# Patient Record
Sex: Male | Born: 1995
Health system: Southern US, Community
[De-identification: ages and names within clinical notes are randomized; demographics above are authoritative.]

## PROBLEM LIST (undated history)

## (undated) DIAGNOSIS — J45909 Unspecified asthma, uncomplicated: Secondary | ICD-10-CM

## (undated) DIAGNOSIS — Z9109 Other allergy status, other than to drugs and biological substances: Secondary | ICD-10-CM

## (undated) HISTORY — DX: Unspecified asthma, uncomplicated: J45.909

## (undated) HISTORY — PX: TONSILLECTOMY AND ADENOIDECTOMY: SUR1326

## (undated) HISTORY — DX: Other allergy status, other than to drugs and biological substances: Z91.09

---
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
## 2015-06-13 ENCOUNTER — Emergency Department
Admission: EM | Admit: 2015-06-13 | Discharge: 2015-06-13 | Disposition: A | Discharge status: Home / Self Care | Payer: Managed Care, Other (non HMO) | Attending: Emergency Medicine | Admitting: Emergency Medicine

## 2015-06-13 ENCOUNTER — Encounter: Payer: Self-pay | Admitting: Emergency Medicine

## 2015-06-13 DIAGNOSIS — Z79899 Other long term (current) drug therapy: Secondary | ICD-10-CM | POA: Diagnosis not present

## 2015-06-13 DIAGNOSIS — Y998 Other external cause status: Secondary | ICD-10-CM | POA: Diagnosis not present

## 2015-06-13 DIAGNOSIS — S199XXA Unspecified injury of neck, initial encounter: Secondary | ICD-10-CM | POA: Diagnosis present

## 2015-06-13 DIAGNOSIS — Y9389 Activity, other specified: Secondary | ICD-10-CM | POA: Diagnosis not present

## 2015-06-13 DIAGNOSIS — S161XXA Strain of muscle, fascia and tendon at neck level, initial encounter: Secondary | ICD-10-CM | POA: Diagnosis not present

## 2015-06-13 DIAGNOSIS — Y92410 Unspecified street and highway as the place of occurrence of the external cause: Secondary | ICD-10-CM | POA: Diagnosis not present

## 2015-06-13 MED ORDER — IBUPROFEN 800 MG PO TABS: 800.0000 mg | ORAL_TABLET | Freq: Three times a day (TID) | ORAL | Status: DC | PRN

## 2015-06-13 MED ORDER — CYCLOBENZAPRINE HCL 10 MG PO TABS
10.0000 mg | ORAL_TABLET | Freq: Once | ORAL | Status: AC
  Administered 2015-06-13: 10 mg via ORAL
  Filled 2015-06-13: qty 1

## 2015-06-13 MED ORDER — IBUPROFEN 800 MG PO TABS
800.0000 mg | ORAL_TABLET | Freq: Once | ORAL | Status: AC
  Administered 2015-06-13: 800 mg via ORAL
  Filled 2015-06-13: qty 1

## 2015-06-13 MED ORDER — CYCLOBENZAPRINE HCL 5 MG PO TABS: 5.0000 mg | ORAL_TABLET | Freq: Three times a day (TID) | ORAL | Status: DC | PRN

## 2015-06-13 NOTE — Discharge Instructions (Signed)
Cervical Sprain A cervical sprain is when the tissues (ligaments) that hold the neck bones in place stretch or tear. HOME CARE   Put ice on the injured area.  Put ice in a plastic bag.  Place a towel between your skin and the bag.  Leave the ice on for 15-20 minutes, 3-4 times a day.  You may have been given a collar to wear. This collar keeps your neck from moving while you heal.  Do not take the collar off unless told by your doctor.  If you have long hair, keep it outside of the collar.  Ask your doctor before changing the position of your collar. You may need to change its position over time to make it more comfortable.  If you are allowed to take off the collar for cleaning or bathing, follow your doctor's instructions on how to do it safely.  Keep your collar clean by wiping it with mild soap and water. Dry it completely. If the collar has removable pads, remove them every 1-2 days to hand wash them with soap and water. Allow them to air dry. They should be dry before you wear them in the collar.  Do not drive while wearing the collar.  Only take medicine as told by your doctor.  Keep all doctor visits as told.  Keep all physical therapy visits as told.  Adjust your work station so that you have good posture while you work.  Avoid positions and activities that make your problems worse.  Warm up and stretch before being active. GET HELP IF:  Your pain is not controlled with medicine.  You cannot take less pain medicine over time as planned.  Your activity level does not improve as expected. GET HELP RIGHT AWAY IF:   You are bleeding.  Your stomach is upset.  You have an allergic reaction to your medicine.  You develop new problems that you cannot explain.  You lose feeling (become numb) or you cannot move any part of your body (paralysis).  You have tingling or weakness in any part of your body.  Your symptoms get worse. Symptoms include:  Pain,  soreness, stiffness, puffiness (swelling), or a burning feeling in your neck.  Pain when your neck is touched.  Shoulder or upper back pain.  Limited ability to move your neck.  Headache.  Dizziness.  Your hands or arms feel week, lose feeling, or tingle.  Muscle spasms.  Difficulty swallowing or chewing. MAKE SURE YOU:   Understand these instructions.  Will watch your condition.  Will get help right away if you are not doing well or get worse.   This information is not intended to replace advice given to you by your health care provider. Make sure you discuss any questions you have with your health care provider.   Document Released: 12/01/2007 Document Revised: 02/14/2013 Document Reviewed: 12/20/2012 Elsevier Interactive Patient Education 2016 [place].  Cryotherapy Cryotherapy means treatment with cold. Ice or gel packs can be used to reduce both pain and swelling. Ice is the most helpful within the first 24 to 48 hours after an injury or flare-up from overusing a muscle or joint. Sprains, strains, spasms, burning pain, shooting pain, and aches can all be eased with ice. Ice can also be used when recovering from surgery. Ice is effective, has very few side effects, and is safe for most people to use. PRECAUTIONS  Ice is not a safe treatment option for people with:  Raynaud phenomenon. This is  a condition affecting small blood vessels in the extremities. Exposure to cold may cause your problems to return.  Cold hypersensitivity. There are many forms of cold hypersensitivity, including:  Cold urticaria. Red, itchy hives appear on the skin when the tissues begin to warm after being iced.  Cold erythema. This is a red, itchy rash caused by exposure to cold.  Cold hemoglobinuria. Red blood cells break down when the tissues begin to warm after being iced. The hemoglobin that carry oxygen are passed into the urine because they cannot combine with blood proteins fast  enough.  Numbness or altered sensitivity in the area being iced. If you have any of the following conditions, do not use ice until you have discussed cryotherapy with your caregiver:  Heart conditions, such as arrhythmia, angina, or chronic heart disease.  High blood pressure.  Healing wounds or open skin in the area being iced.  Current infections.  Rheumatoid arthritis.  Poor circulation.  Diabetes. Ice slows the blood flow in the region it is applied. This is beneficial when trying to stop inflamed tissues from spreading irritating chemicals to surrounding tissues. However, if you expose your skin to cold temperatures for too long or without the proper protection, you can damage your skin or nerves. Watch for signs of skin damage due to cold. HOME CARE INSTRUCTIONS Follow these tips to use ice and cold packs safely.  Place a dry or damp towel between the ice and skin. A damp towel will cool the skin more quickly, so you may need to shorten the time that the ice is used.  For a more rapid response, add gentle compression to the ice.  Ice for no more than 10 to 20 minutes at a time. The bonier the area you are icing, the less time it will take to get the benefits of ice.  Check your skin after 5 minutes to make sure there are no signs of a poor response to cold or skin damage.  Rest 20 minutes or more between uses.  Once your skin is numb, you can end your treatment. You can test numbness by very lightly touching your skin. The touch should be so light that you do not see the skin dimple from the pressure of your fingertip. When using ice, most people will feel these normal sensations in this order: cold, burning, aching, and numbness.  Do not use ice on someone who cannot communicate their responses to pain, such as small children or people with dementia. HOW TO MAKE AN ICE PACK Ice packs are the most common way to use ice therapy. Other methods include ice massage, ice baths,  and cryosprays. Muscle creams that cause a cold, tingly feeling do not offer the same benefits that ice offers and should not be used as a substitute unless recommended by your caregiver. To make an ice pack, do one of the following:  Place crushed ice or a bag of frozen vegetables in a sealable plastic bag. Squeeze out the excess air. Place this bag inside another plastic bag. Slide the bag into a pillowcase or place a damp towel between your skin and the bag.  Mix 3 parts water with 1 part rubbing alcohol. Freeze the mixture in a sealable plastic bag. When you remove the mixture from the freezer, it will be slushy. Squeeze out the excess air. Place this bag inside another plastic bag. Slide the bag into a pillowcase or place a damp towel between your skin and the bag. SEEK  MEDICAL CARE IF:  You develop white spots on your skin. This may give the skin a blotchy (mottled) appearance.  Your skin turns blue or pale.  Your skin becomes waxy or hard.  Your swelling gets worse. MAKE SURE YOU:   Understand these instructions.  Will watch your condition.  Will get help right away if you are not doing well or get worse.   This information is not intended to replace advice given to you by your health care provider. Make sure you discuss any questions you have with your health care provider.   Document Released: 02/08/2011 Document Revised: 07/05/2014 Document Reviewed: 02/08/2011 Elsevier Interactive Patient Education [employer].
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
## 2015-07-22 ENCOUNTER — Ambulatory Visit: Payer: Managed Care, Other (non HMO) | Admitting: Physical Therapy

## 2015-07-24 ENCOUNTER — Encounter: Payer: Managed Care, Other (non HMO) | Admitting: Physical Therapy

## 2015-07-24 ENCOUNTER — Ambulatory Visit: Payer: Managed Care, Other (non HMO) | Attending: Pediatrics | Admitting: Physical Therapy

## 2015-07-24 DIAGNOSIS — M542 Cervicalgia: Secondary | ICD-10-CM | POA: Diagnosis present
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
## 2015-07-24 NOTE — Patient Instructions (Signed)
All exercises provided were adapted from [email or web address]. Patient was provided a written handout with pictures as described. Any additional cues were manually entered in to handout and copied in to this document.  Rows  Rows  Do this sitting  Wrap a theraband around a doorknob.  Start with your elbow straight, then pull back, bending your elbows and squeezing your shoulder blades together.  Slowly return to starting position.     Low Rows  With elbows straight and palms facing forward, pull band back and pinch shoulder blades together down and back.  DO NOT SHRUG

## 2015-07-29 ENCOUNTER — Ambulatory Visit: Payer: Managed Care, Other (non HMO) | Admitting: Physical Therapy

## 2015-07-31 ENCOUNTER — Ambulatory Visit: Payer: Managed Care, Other (non HMO) | Attending: Pediatrics | Admitting: Physical Therapy

## 2015-08-05 ENCOUNTER — Ambulatory Visit: Payer: Managed Care, Other (non HMO) | Admitting: Physical Therapy

## 2015-08-07 ENCOUNTER — Encounter: Payer: Managed Care, Other (non HMO) | Admitting: Physical Therapy

## 2017-07-29 ENCOUNTER — Ambulatory Visit: Payer: Commercial Managed Care - PPO | Attending: Neurology

## 2017-07-29 DIAGNOSIS — R0683 Snoring: Secondary | ICD-10-CM | POA: Diagnosis present

## 2017-07-29 DIAGNOSIS — G4733 Obstructive sleep apnea (adult) (pediatric): Secondary | ICD-10-CM | POA: Insufficient documentation

## 2017-07-30 ENCOUNTER — Ambulatory Visit: Payer: Commercial Managed Care - PPO | Attending: Neurology

## 2017-07-30 DIAGNOSIS — G47419 Narcolepsy without cataplexy: Secondary | ICD-10-CM | POA: Insufficient documentation

## 2017-07-30 DIAGNOSIS — G4710 Hypersomnia, unspecified: Secondary | ICD-10-CM | POA: Insufficient documentation

## 2017-07-30 DIAGNOSIS — G4733 Obstructive sleep apnea (adult) (pediatric): Secondary | ICD-10-CM | POA: Diagnosis not present

## 2017-07-30 DIAGNOSIS — R0683 Snoring: Secondary | ICD-10-CM | POA: Diagnosis present

## 2017-11-23 ENCOUNTER — Emergency Department (HOSPITAL_BASED_OUTPATIENT_CLINIC_OR_DEPARTMENT_OTHER)
Admission: EM | Admit: 2017-11-23 | Discharge: 2017-11-23 | Disposition: A | Discharge status: Home / Self Care | Payer: Commercial Managed Care - PPO | Attending: Emergency Medicine | Admitting: Emergency Medicine

## 2017-11-23 ENCOUNTER — Encounter (HOSPITAL_BASED_OUTPATIENT_CLINIC_OR_DEPARTMENT_OTHER): Payer: Self-pay | Admitting: Emergency Medicine

## 2017-11-23 ENCOUNTER — Other Ambulatory Visit: Payer: Self-pay

## 2017-11-23 DIAGNOSIS — J45909 Unspecified asthma, uncomplicated: Secondary | ICD-10-CM | POA: Insufficient documentation

## 2017-11-23 DIAGNOSIS — R040 Epistaxis: Secondary | ICD-10-CM

## 2017-11-23 DIAGNOSIS — R0982 Postnasal drip: Secondary | ICD-10-CM | POA: Diagnosis not present

## 2017-11-23 MED ORDER — OXYMETAZOLINE HCL 0.05 % NA SOLN
1.0000 | Freq: Once | NASAL | Status: AC
  Administered 2017-11-23: 1 via NASAL
  Filled 2017-11-23: qty 15

## 2017-11-23 NOTE — ED Triage Notes (Signed)
Nose bleed since this morning.

## 2017-11-23 NOTE — ED Notes (Signed)
ED Provider at bedside. 

## 2017-11-23 NOTE — ED Provider Notes (Signed)
[facility] EMERGENCY DEPARTMENT Provider Note   CSN: [id number] Arrival date & time: 11/23/17  0809     History   Chief Complaint Chief Complaint  Patient presents with  . Epistaxis    HPI Gary Simpson is a 22 y.o. male.  22 year old male with history of asthma and seasonal allergies who presents with nosebleed.  While he was driving to work this morning, he had a sudden onset of bleeding from his right naris.  He held pressure but it continued to run down the back of his throat.  He denies any recent trauma.  He does have some seasonal allergy symptoms and occasionally takes over-the-counter medications for it.  He has had one other nosebleed recently but denies any significant history of bleeding.  No anticoagulant use.  The history is provided by the patient.    Past Medical History:  Diagnosis Date  . Asthma   . Environmental allergies     There are no active problems to display for this patient.   History reviewed. No pertinent surgical history.      Home Medications    Prior to Admission medications   Medication Sig Start Date End Date Taking? Authorizing Provider  cyclobenzaprine (FLEXERIL) 5 MG tablet Take 1 tablet (5 mg total) by mouth every 8 (eight) hours as needed for muscle spasms. 06/13/15   Duanne Guess, PA-C  Dexmethylphenidate HCl (FOCALIN PO) Take by mouth daily.    [provider]  ibuprofen (ADVIL,MOTRIN) 800 MG tablet Take 1 tablet (800 mg total) by mouth every 8 (eight) hours as needed. 06/13/15   Duanne Guess, PA-C    Family History No family history on file.  Social History Social History   Tobacco Use  . Smoking status: Never Smoker  . Smokeless tobacco: Never Used  Substance Use Topics  . Alcohol use: No  . Drug use: No     Allergies   Patient has no known allergies.   Review of Systems Review of Systems  HENT: Positive for congestion, nosebleeds and postnasal drip.   Gastrointestinal:  Negative for vomiting.  Neurological: Negative for dizziness and light-headedness.  All other systems reviewed and are negative.    Physical Exam Updated Vital Signs BP 124/63 (BP Location: Right Arm)   Pulse 70   Temp 97.6 F (36.4 C) (Oral)   Resp 18   Ht 6\' 6"  (1.981 m)   Wt 104.3 kg (230 lb)   SpO2 99%   BMI 26.58 kg/m   Physical Exam  Constitutional: He is oriented to person, place, and time. He appears well-developed and well-nourished. No distress.  HENT:  Head: Normocephalic and atraumatic.  Blood in R naris, oropharynx clear and moist  Eyes: Conjunctivae are normal.  Neck: Neck supple.  Neurological: He is alert and oriented to person, place, and time.  Skin: Skin is warm and dry.  Dried blood on neck and clothes  Psychiatric: He has a normal mood and affect. Judgment normal.  Nursing note and vitals reviewed.    ED Treatments / Results  Labs (all labs ordered are listed, but only abnormal results are displayed) Labs Reviewed - No data to display  EKG None  Radiology No results found.  Procedures Procedures (including critical care time)  Medications Ordered in ED Medications  oxymetazoline (AFRIN) 0.05 % nasal spray 1 spray (1 spray Each Nare Given 11/23/17 4097)     Initial Impression / Assessment and Plan / ED Course  I have reviewed  the triage vital signs and the nursing notes.    Well appearing, stable VS, breathing comfortably. Applied afrin and held pressure for ~15 min which achieved hemostasis.  No further bleeding during observation.  Discussed supportive measures at home, recommended nasal saline and no nose blowing as well as daily use of allergy medication.  Return precautions extensively reviewed and he voiced understanding.  Final Clinical Impressions(s) / ED Diagnoses   Final diagnoses:  Right-sided epistaxis    ED Discharge Orders    None       Lanett Lasorsa, Wenda Overland, MD 11/23/17 [id number]

## 2017-12-09 ENCOUNTER — Observation Stay (HOSPITAL_COMMUNITY)
Admission: EM | Admit: 2017-12-09 | Discharge: 2017-12-10 | Disposition: A | Discharge status: Home / Self Care | Payer: Commercial Managed Care - PPO | Attending: Internal Medicine | Admitting: Internal Medicine

## 2017-12-09 ENCOUNTER — Other Ambulatory Visit: Payer: Self-pay

## 2017-12-09 ENCOUNTER — Encounter (HOSPITAL_COMMUNITY): Payer: Self-pay

## 2017-12-09 DIAGNOSIS — R040 Epistaxis: Secondary | ICD-10-CM | POA: Diagnosis not present

## 2017-12-09 DIAGNOSIS — D62 Acute posthemorrhagic anemia: Secondary | ICD-10-CM | POA: Diagnosis not present

## 2017-12-09 DIAGNOSIS — D649 Anemia, unspecified: Secondary | ICD-10-CM | POA: Diagnosis not present

## 2017-12-09 DIAGNOSIS — J4530 Mild persistent asthma, uncomplicated: Secondary | ICD-10-CM | POA: Diagnosis present

## 2017-12-09 DIAGNOSIS — E876 Hypokalemia: Secondary | ICD-10-CM | POA: Diagnosis not present

## 2017-12-09 DIAGNOSIS — Z79899 Other long term (current) drug therapy: Secondary | ICD-10-CM | POA: Diagnosis not present

## 2017-12-09 DIAGNOSIS — J4520 Mild intermittent asthma, uncomplicated: Secondary | ICD-10-CM | POA: Diagnosis present

## 2017-12-09 LAB — CBC WITH DIFFERENTIAL/PLATELET
Basophils Absolute: 0.1 K/uL (ref 0.0–0.1)
Basophils Absolute: 0 10*3/uL (ref 0.0–0.1)
Basophils Relative: 1 %
Basophils Relative: 0 %
Eosinophils Absolute: 0.2 K/uL (ref 0.0–0.7)
Eosinophils Absolute: 0 10*3/uL (ref 0.0–0.7)
Eosinophils Relative: 4 %
Eosinophils Relative: 1 %
HCT: 33.5 % — ABNORMAL LOW (ref 39.0–52.0)
HCT: 28.4 % — ABNORMAL LOW (ref 39.0–52.0)
Hemoglobin: 11.8 g/dL — ABNORMAL LOW (ref 13.0–17.0)
Hemoglobin: 9.8 g/dL — ABNORMAL LOW (ref 13.0–17.0)
Lymphocytes Relative: 33 %
Lymphocytes Relative: 13 %
Lymphs Abs: 2 K/uL (ref 0.7–4.0)
Lymphs Abs: 1.1 10*3/uL (ref 0.7–4.0)
MCH: 32.6 pg (ref 26.0–34.0)
MCH: 32.1 pg (ref 26.0–34.0)
MCHC: 35.2 g/dL (ref 30.0–36.0)
MCHC: 34.5 g/dL (ref 30.0–36.0)
MCV: 92.5 fL (ref 78.0–100.0)
MCV: 93.1 fL (ref 78.0–100.0)
Monocytes Absolute: 0.8 K/uL (ref 0.1–1.0)
Monocytes Absolute: 0.6 10*3/uL (ref 0.1–1.0)
Monocytes Relative: 13 %
Monocytes Relative: 7 %
Neutro Abs: 3 K/uL (ref 1.7–7.7)
Neutro Abs: 6.8 10*3/uL (ref 1.7–7.7)
Neutrophils Relative %: 49 %
Neutrophils Relative %: 79 %
Platelets: 249 K/uL (ref 150–400)
Platelets: 189 10*3/uL (ref 150–400)
RBC: 3.62 MIL/uL — ABNORMAL LOW (ref 4.22–5.81)
RBC: 3.05 MIL/uL — ABNORMAL LOW (ref 4.22–5.81)
RDW: 12.7 % (ref 11.5–15.5)
RDW: 12.9 % (ref 11.5–15.5)
WBC: 6 K/uL (ref 4.0–10.5)
WBC: 8.6 10*3/uL (ref 4.0–10.5)

## 2017-12-09 LAB — POC OCCULT BLOOD, ED: Fecal Occult Bld: NEGATIVE

## 2017-12-09 LAB — I-STAT CG4 LACTIC ACID, ED: Lactic Acid, Venous: 1.14 mmol/L (ref 0.5–1.9)

## 2017-12-09 LAB — TYPE AND SCREEN
ABO/RH(D): O POS
Antibody Screen: NEGATIVE

## 2017-12-09 LAB — ABO/RH: ABO/RH(D): O POS

## 2017-12-09 LAB — BASIC METABOLIC PANEL WITH GFR
Anion gap: 7 (ref 5–15)
BUN: 23 mg/dL — ABNORMAL HIGH (ref 6–20)
CO2: 25 mmol/L (ref 22–32)
Calcium: 8.7 mg/dL — ABNORMAL LOW (ref 8.9–10.3)
Chloride: 109 mmol/L (ref 101–111)
Creatinine, Ser: 0.77 mg/dL (ref 0.61–1.24)
GFR calc Af Amer: >60 mL/min
GFR calc non Af Amer: >60 mL/min
Glucose, Bld: 160 mg/dL — ABNORMAL HIGH (ref 65–99)
Potassium: 3.4 mmol/L — ABNORMAL LOW (ref 3.5–5.1)
Sodium: 141 mmol/L (ref 135–145)

## 2017-12-09 LAB — APTT: aPTT: 27 seconds (ref 24–36)

## 2017-12-09 LAB — I-STAT TROPONIN, ED: Troponin i, poc: 0 ng/mL (ref 0.00–0.08)

## 2017-12-09 LAB — PROTIME-INR
INR: 1.15
Prothrombin Time: 14.6 seconds (ref 11.4–15.2)

## 2017-12-09 MED ORDER — ACETAMINOPHEN 325 MG PO TABS: 650.0000 mg | ORAL_TABLET | Freq: Four times a day (QID) | ORAL | Status: DC | PRN

## 2017-12-09 MED ORDER — ONDANSETRON HCL 4 MG/2ML IJ SOLN: 4.0000 mg | Freq: Four times a day (QID) | INTRAMUSCULAR | Status: DC | PRN

## 2017-12-09 MED ORDER — HYDROCODONE-ACETAMINOPHEN 5-325 MG PO TABS: 1.0000 | ORAL_TABLET | ORAL | Status: DC | PRN

## 2017-12-09 MED ORDER — SODIUM CHLORIDE 0.9 % IV BOLUS
1000.0000 mL | Freq: Once | INTRAVENOUS | Status: AC
  Administered 2017-12-09: 1000 mL via INTRAVENOUS

## 2017-12-09 MED ORDER — ONDANSETRON HCL 4 MG PO TABS: 4.0000 mg | ORAL_TABLET | Freq: Four times a day (QID) | ORAL | Status: DC | PRN

## 2017-12-09 MED ORDER — SILVER NITRATE-POT NITRATE 75-25 % EX MISC
2.0000 | Freq: Once | CUTANEOUS | Status: DC
  Filled 2017-12-09: qty 2

## 2017-12-09 MED ORDER — COCAINE HCL 4 % EX SOLN
4.0000 mL | Freq: Once | CUTANEOUS | Status: DC
  Filled 2017-12-09: qty 4

## 2017-12-09 MED ORDER — SODIUM CHLORIDE 0.9 % IV SOLN
INTRAVENOUS | Status: AC
  Administered 2017-12-09: 23:00:00 via INTRAVENOUS

## 2017-12-09 MED ORDER — ACETAMINOPHEN 650 MG RE SUPP: 650.0000 mg | Freq: Four times a day (QID) | RECTAL | Status: DC | PRN

## 2017-12-09 MED ORDER — OXYMETAZOLINE HCL 0.05 % NA SOLN
1.0000 | Freq: Once | NASAL | Status: AC
  Administered 2017-12-09: 1 via NASAL
  Filled 2017-12-09: qty 15

## 2017-12-09 MED ORDER — ONDANSETRON 4 MG PO TBDP
4.0000 mg | ORAL_TABLET | Freq: Once | ORAL | Status: AC
  Administered 2017-12-09: 4 mg via ORAL
  Filled 2017-12-09: qty 1

## 2017-12-09 NOTE — ED Triage Notes (Signed)
Patient reports that he has had several nosebleed in the past 2 weeks. Patient  States he has been having black stools as well.

## 2017-12-09 NOTE — ED Notes (Signed)
ED TO INPATIENT HANDOFF REPORT  Name/Age/Gender Gary Simpson 22 y.o. male  Code Status   Home/SNF/Other Home  Chief Complaint epistaxis  Level of Care/Admitting Diagnosis ED Disposition    ED Disposition Condition [facility] [place] Hospital Area: [place] [[id number]]  Level of Care: Telemetry [5]  Admit to tele based on following criteria: Other see comments  Comments: tachycaRDIA  Diagnosis: Symptomatic anemia [2458099]  Admitting Physician: Toy Crisanti [[id number]]  Attending Physician: Toy Marsicano [[id number]]  PT Class (Do Not Modify): Observation [104]  PT Acc Code (Do Not Modify): Observation [10022]       Medical History Past Medical History:  Diagnosis Date  . Asthma   . Environmental allergies     Allergies Allergies  Allergen Reactions  . Other     Cats and Dogs.    IV Location/Drains/Wounds Patient Lines/Drains/Airways Status   Active Line/Drains/Airways    Name:   Placement date:   Placement time:   Site:   Days:   Peripheral IV 12/09/17 Left Hand   12/09/17    1306    Hand   less than 1   Peripheral IV 12/09/17 Right Antecubital   12/09/17    1307    Antecubital   less than 1          Labs/Imaging Results for orders placed or performed during the hospital encounter of 12/09/17 (from the past 48 hour(s))  Type and screen [place]     Status: None   Collection Time: 12/09/17  1:10 PM  Result Value Ref Range   ABO/RH(D) O POS    Antibody Screen NEG    Sample Expiration      12/12/2017 Performed at [facility], [place] [address] [place], [place] [address]   Basic metabolic panel     Status: Abnormal   Collection Time: 12/09/17  1:12 PM  Result Value Ref Range   Sodium 141 135 - 145 mmol/L   Potassium 3.4 (L) 3.5 - 5.1 mmol/L   Chloride 109 101 - 111 mmol/L   CO2 25 22 - 32 mmol/L   Glucose, Bld 160 (H) 65 - 99 mg/dL   BUN 23 (H) 6 - 20 mg/dL   Creatinine, Ser 0.77 0.61  - 1.24 mg/dL   Calcium 8.7 (L) 8.9 - 10.3 mg/dL   GFR calc non Af Amer >60 >60 mL/min   GFR calc Af Amer >60 >60 mL/min    Comment: (NOTE) The eGFR has been calculated using the CKD EPI equation. This calculation has not been validated in all clinical situations. eGFR's persistently <60 mL/min signify possible Chronic Kidney Disease.    Anion gap 7 5 - 15    Comment: Performed at [facility], [place] [address], [place], [place] [address]  CBC with Differential     Status: Abnormal   Collection Time: 12/09/17  1:12 PM  Result Value Ref Range   WBC 6.0 4.0 - 10.5 K/uL   RBC 3.62 (L) 4.22 - 5.81 MIL/uL   Hemoglobin 11.8 (L) 13.0 - 17.0 g/dL   HCT 33.5 (L) 39.0 - 52.0 %   MCV 92.5 78.0 - 100.0 fL   MCH 32.6 26.0 - 34.0 pg   MCHC 35.2 30.0 - 36.0 g/dL   RDW 12.7 11.5 - 15.5 %   Platelets 249 150 - 400 K/uL   Neutrophils Relative % 49 %   Neutro Abs 3.0 1.7 - 7.7 K/uL   Lymphocytes  Relative 33 %   Lymphs Abs 2.0 0.7 - 4.0 K/uL   Monocytes Relative 13 %   Monocytes Absolute 0.8 0.1 - 1.0 K/uL   Eosinophils Relative 4 %   Eosinophils Absolute 0.2 0.0 - 0.7 K/uL   Basophils Relative 1 %   Basophils Absolute 0.1 0.0 - 0.1 K/uL    Comment: Performed at [facility], [place] [address], [place], [place] [address]  Protime-INR     Status: None   Collection Time: 12/09/17  1:12 PM  Result Value Ref Range   Prothrombin Time 14.6 11.4 - 15.2 seconds   INR 1.15     Comment: Performed at [facility], [place] [address], [place], [place] [address]  APTT     Status: None   Collection Time: 12/09/17  1:12 PM  Result Value Ref Range   aPTT 27 24 - 36 seconds    Comment: Performed at [facility], [place] [address] [place], [place] [address]  I-Stat Troponin, ED (not at [facility])     Status: None   Collection Time: 12/09/17  1:15 PM  Result Value Ref Range   Troponin i, poc 0.00 0.00 - 0.08 ng/mL   Comment 3            Comment:  Due to the release kinetics of cTnI, a negative result within the first hours of the onset of symptoms does not rule out myocardial infarction with certainty. If myocardial infarction is still suspected, repeat the test at appropriate intervals.   ABO/Rh     Status: None   Collection Time: 12/09/17  1:16 PM  Result Value Ref Range   ABO/RH(D)      O POS Performed at [facility], [place] [address] [place], [place] [address]   POC occult blood, ED Provider will collect     Status: None   Collection Time: 12/09/17  1:47 PM  Result Value Ref Range   Fecal Occult Bld NEGATIVE NEGATIVE  I-Stat CG4 Lactic Acid, ED     Status: None   Collection Time: 12/09/17  2:50 PM  Result Value Ref Range   Lactic Acid, Venous 1.14 0.5 - 1.9 mmol/L  CBC with Differential     Status: Abnormal   Collection Time: 12/09/17  5:37 PM  Result Value Ref Range   WBC 8.6 4.0 - 10.5 K/uL   RBC 3.05 (L) 4.22 - 5.81 MIL/uL   Hemoglobin 9.8 (L) 13.0 - 17.0 g/dL   HCT 28.4 (L) 39.0 - 52.0 %   MCV 93.1 78.0 - 100.0 fL   MCH 32.1 26.0 - 34.0 pg   MCHC 34.5 30.0 - 36.0 g/dL   RDW 12.9 11.5 - 15.5 %   Platelets 189 150 - 400 K/uL   Neutrophils Relative % 79 %   Neutro Abs 6.8 1.7 - 7.7 K/uL   Lymphocytes Relative 13 %   Lymphs Abs 1.1 0.7 - 4.0 K/uL   Monocytes Relative 7 %   Monocytes Absolute 0.6 0.1 - 1.0 K/uL   Eosinophils Relative 1 %   Eosinophils Absolute 0.0 0.0 - 0.7 K/uL   Basophils Relative 0 %   Basophils Absolute 0.0 0.0 - 0.1 K/uL    Comment: Performed at [facility], [place] [address] [place], [place] [address]   No results found.  Pending Labs [employer] (From admission, onward)   Start     Ordered   Signed and Held  HIV antibody (Routine  Testing)  Tomorrow morning,   R     Signed and Held   Signed and Held  Magnesium  Tomorrow morning,   R    Comments:  Call MD if <1.5    Signed and Held   Signed and Held  Phosphorus  Tomorrow morning,   R      Signed and Held   Signed and Held  TSH  Once,   R    Comments:  Cancel if already done within 1 month and notify MD    Signed and Held   Signed and Held  Comprehensive metabolic panel  Once,   R    Comments:  Cal MD for K<3.5 or >5.0    Signed and Held   Signed and Held  CBC  Once,   R    Comments:  Call for hg <8.0    Signed and Held   Signed and Held  CBC  Now then every 8 hours,   R    Comments:  Call md if Hg<8    Signed and Held      Vitals/Pain Today's Vitals   12/09/17 1600 12/09/17 1745 12/09/17 1800 12/09/17 1929  BP: 113/63  130/86 (!) 142/81  Pulse: (!) 104  100 96  Resp: _0 SpO2: 100%  99% 100%  Weight:      Height:      PainSc:  0-No pain      Isolation Precautions No active isolations  Medications Medications  sodium chloride 0.9 % bolus 1,000 mL (has no administration in time range)  oxymetazoline (AFRIN) 0.05 % nasal spray 1 spray (1 spray Each Nare Given 12/09/17 1305)  sodium chloride 0.9 % bolus 1,000 mL (0 mLs Intravenous Stopped 12/09/17 1344)  sodium chloride 0.9 % bolus 1,000 mL (0 mLs Intravenous Stopped 12/09/17 1607)  ondansetron (ZOFRAN-ODT) disintegrating tablet 4 mg (4 mg Oral Given 12/09/17 1706)    Mobility walks with person assist

## 2017-12-09 NOTE — ED Notes (Signed)
ED Provider at bedside. 

## 2017-12-09 NOTE — ED Notes (Signed)
ENT at  beside

## 2017-12-09 NOTE — H&P (Signed)
JIA MOHAMED YTK:[id number] DOB: 1995/09/25 DOA: 12/09/2017     PCP: Erma Pinto, MD   Outpatient Specialists:  NONE   Patient arrived to ER on 12/09/17 at 1248  Patient coming from:   home Lives   With family    Chief Complaint:  Chief Complaint  Patient presents with  . Epistaxis    HPI: BURT PIATEK is a 22 y.o. male with medical history significant of asthma and seasonal allergies     Presented with recurrent nose bleed. Last nose bleed was on 29th of May. And was not relieved by Afrin or epinephrine packing urgent care was started on dexmethylphenidate. He was seen in the emergency department where it was controlled.  On nasal saline spray but continued to have nosebleeds every other day. Nosebleeding has been persistent on the right side he has been getting lightheaded when he tries to stand up although never passed out no associated chest pain abdominal pain.  Occasionally has been vomiting blood back-up when he has swallowed a large amount of it. Patient has been on Flonase Today presents with recurrent nosebleeding as well as black tarry stools.  Regarding pertinent Chronic problems: History of seasonal allergies on Flonase   While in ER: Noted to be tachycardic rectal exam done showed no evidence of gross bleeding  Following Medications were ordered in ER: Medications  oxymetazoline (AFRIN) 0.05 % nasal spray 1 spray (1 spray Each Nare Given 12/09/17 1305)  sodium chloride 0.9 % bolus 1,000 mL (0 mLs Intravenous Stopped 12/09/17 1344)  sodium chloride 0.9 % bolus 1,000 mL (0 mLs Intravenous Stopped 12/09/17 1607)  ondansetron (ZOFRAN-ODT) disintegrating tablet 4 mg (4 mg Oral Given 12/09/17 1706)    Significant initial  Findings: Abnormal Labs Reviewed  BASIC METABOLIC PANEL - Abnormal; Notable for the following components:      Result Value   Potassium 3.4 (*)    Glucose, Bld 160 (*)    BUN 23 (*)    Calcium 8.7 (*)    All other components within  normal limits  CBC WITH DIFFERENTIAL/PLATELET - Abnormal; Notable for the following components:   RBC 3.62 (*)    Hemoglobin 11.8 (*)    HCT 33.5 (*)    All other components within normal limits  CBC WITH DIFFERENTIAL/PLATELET - Abnormal; Notable for the following components:   RBC 3.05 (*)    Hemoglobin 9.8 (*)    HCT 28.4 (*)    All other components within normal limits     Na 141 K 3.4 Cr      Lab Results  Component Value Date   CREATININE 0.77 12/09/2017      WBC  8.6  HG/HCT     Component Value Date/Time   HGB 9.8 (L) 12/09/2017 1737   HCT 28.4 (L) 12/09/2017 1737     PLT 189  INR 1.15  Troponin (Point of Care Test) Recent Labs    12/09/17 1315  TROPIPOC 0.00    Lactic Acid, Venous    Component Value Date/Time   LATICACIDVEN 1.14 12/09/2017 1450     UA not ordered     ECG:  Personally reviewed by me showing: HR : 117 Rhythm: sinus tachycardia   no evidence of ischemic changes QTC 451    ED Triage Vitals  Enc Vitals Group     BP 12/09/17 1304 136/77     Pulse Rate 12/09/17 1304 (!) 106     Resp 12/09/17 1304 (!) 21  Temp --      Temp src --      SpO2 12/09/17 1304 99 %     Weight 12/09/17 1255 230 lb (104.3 kg)     Height 12/09/17 1255 6\' 6"  (1.981 m)     Head Circumference --      Peak Flow --      Pain Score 12/09/17 1255 0     Pain Loc --      Pain Edu? --      Excl. in [place]? --   TMAX(24)@       Latest  Blood pressure 130/86, pulse 100, resp. rate 18, height 6\' 6"  (1.981 m), weight 104.3 kg (230 lb), SpO2 99 %.   ER Provider Called: ENT    Dr.Bates Outpatient follow-up scheduled with  ENT Dr. Tami Ribas.  [place] office.     Hospitalist was called for admission for symptomatic anemia in the setting of recurent epistaxis   Review of Systems:    Pertinent positives include: nose bleeds, fatigue,  melena, dizziness,  Constitutional:  No weight loss, night sweats, Fevers, chills, weight loss  HEENT:  No  headaches, Difficulty swallowing,Tooth/dental problems,Sore throat,  No sneezing, itching, ear ache, nasal congestion, post nasal drip,  Cardio-vascular:  No chest pain, Orthopnea, PND, anasarca, palpitations. no Bilateral lower extremity swelling  GI:  No heartburn, indigestion, abdominal pain, nausea, vomiting, diarrhea, change in bowel habits, loss of appetite, blood in stool, hematemesis Resp:  no shortness of breath at rest. No dyspnea on exertion, No excess mucus, no productive cough, No non-productive cough, No coughing up of blood.No change in color of mucus.No wheezing. Skin:  no rash or lesions. No jaundice GU:  no dysuria, change in color of urine, no urgency or frequency. No straining to urinate.  No flank pain.  Musculoskeletal:  No joint pain or no joint swelling. No decreased range of motion. No back pain.  Psych:  No change in mood or affect. No depression or anxiety. No memory loss.  Neuro: no localizing neurological complaints, no tingling, no weakness, no double vision, no gait abnormality, no slurred speech, no confusion  As per HPI otherwise 10 point review of systems negative.   Past Medical History:   Past Medical History:  Diagnosis Date  . Asthma   . Environmental allergies       History reviewed. No pertinent surgical history.  Social History:  Ambulatory   independently      reports that he has never smoked. He has never used smokeless tobacco. He reports that he does not drink alcohol or use drugs.     Family History:   Family History  Problem Relation Age of Onset  . Diabetes Mother   . Cancer Neg Hx   . CAD Neg Hx     Allergies: Allergies  Allergen Reactions  . Other     Cats and Dogs.     Prior to Admission medications   Medication Sig Start Date End Date Taking? Authorizing Provider  fluticasone (FLONASE) 50 MCG/ACT nasal spray Place 1 spray into both nostrils daily.  11/30/17  Yes [provider]  cyclobenzaprine  (FLEXERIL) 5 MG tablet Take 1 tablet (5 mg total) by mouth every 8 (eight) hours as needed for muscle spasms. Patient not taking: Reported on 12/09/2017 06/13/15   Duanne Guess, PA-C  ibuprofen (ADVIL,MOTRIN) 800 MG tablet Take 1 tablet (800 mg total) by mouth every 8 (eight) hours as needed. Patient not taking: Reported on  12/09/2017 06/13/15   Duanne Guess, PA-C   Physical Exam: Blood pressure 130/86, pulse 100, resp. rate 18, height 6\' 6"  (1.981 m), weight 104.3 kg (230 lb), SpO2 99 %. 1. General:  in No Acute distress, heavily bloodstained clothes  well  -appearing 2. Psychological: Alert and   Oriented 3. Head/ENT:   jjDry Mucous Membranes                          Head Non traumatic, neck supple                          Normal   Dentition 4. SKIN:  decreased Skin turgor,  Skin clean Dry and intact no rash 5. Heart: Regular rate and rhythm no  Murmur, no Rub or gallop 6. Lungs: no wheezes or crackles   7. Abdomen: Soft, non-tender, Non distended bowel sounds present 8. Lower extremities: no clubbing, cyanosis, or edema 9. Neurologically Grossly intact, moving all 4 extremities equally 10. MSK: Normal range of motion   LABS:     Recent Labs  Lab 12/09/17 1312 12/09/17 1737  WBC 6.0 8.6  NEUTROABS 3.0 6.8  HGB 11.8* 9.8*  HCT 33.5* 28.4*  MCV 92.5 93.1  PLT 249 737   Basic Metabolic Panel: Recent Labs  Lab 12/09/17 1312  NA 141  K 3.4*  CL 109  CO2 25  GLUCOSE 160*  BUN 23*  CREATININE 0.77  CALCIUM 8.7*      No results for input(s): AST, ALT, ALKPHOS, BILITOT, PROT, ALBUMIN in the last 168 hours. No results for input(s): LIPASE, AMYLASE in the last 168 hours. No results for input(s): AMMONIA in the last 168 hours.    HbA1C: No results for input(s): HGBA1C in the last 72 hours. CBG: No results for input(s): GLUCAP in the last 168 hours.    Urine analysis: No results found for: COLORURINE, APPEARANCEUR, LABSPEC, PHURINE, GLUCOSEU, HGBUR,  BILIRUBINUR, KETONESUR, PROTEINUR, UROBILINOGEN, NITRITE, LEUKOCYTESUR     Cultures: No results found for: [place], [place], [place], REPTSTATUS   Radiological Exams on Admission: No results found.  Chart has been reviewed    Assessment/Plan  22 y.o. male with medical history significant of asthma and seasonal allergies   Admitted forsymptomatic anemia in the setting of recurent epistaxis    Present on Admission: . Epistaxis -severe requiring hospitalization has been treated and stabilized in the emergency department if recurrence will need ENT consult.  We will have ENT follow-up as an outpatient continue to monitor CBC rehydrate transfuse if hemoglobin continues to drop or if evidence severe epistaxis continues . Symptomatic anemia noted hemoglobin dropped 2 units at this point epistaxis have stopped.  Continue to monitor if recurs or hemoglobin continues to drop may need blood transfusion. Patient states she still feels lightheaded we will continue to rehydrate . Asthma, chronic, mild persistent, uncomplicated stable continue albuterol as needed . Hypokalemia we will replace check magnesium level   Other plan as per orders.  DVT prophylaxis:  SCD    Code Status:  FULL CODE  as per patient   I had personally discussed CODE STATUS with patient   Family Communication:   Family not at  Bedside    Disposition Plan:  To home once workup is complete and patient is stable                       Consults called:  ENT is aware   Admission status: obs   Level of care    tele            Toy Rockwood 12/09/2017, 8:46 PM    [facility]  Pager [phone]   after 2 AM please page floor coverage PA If 7AM-7PM, please contact the day team taking care of the patient  [email or web address]  Password [email or web address]

## 2017-12-09 NOTE — ED Provider Notes (Signed)
[facility] DEPT Provider Note   CSN: [id number] Arrival date & time: 12/09/17  1248     History   Chief Complaint Chief Complaint  Patient presents with  . Epistaxis    HPI Gary Simpson is a 22 y.o. male with history of asthma and seasonal allergies presents for evaluation of acute onset, progressively worsening intermittent nosebleeds for the past 2 weeks.  He was seen and evaluated for this on 11/23/2017 in the ED.  At that time he was driving to work and had sudden onset bleeding from his right nare  And was unable to control the bleeding.  At that time Afrin was applied and pressure was held for approximately 15 minutes which achieved hemostasis.  He was discharged with nasal saline spray.  He states that since then he has had nosebleeds at least every other day if not up to 3 times daily which will take "a long time to stop the bleeding ".  3 days ago he noticed development of melanotic dark, tarry stools.  Denies hematuria.  He is not on any blood thinners.  He typically attempts to apply constant steady pressure but it still takes a long time to achieve hemostasis.  Nosebleed is always on the right side.  He does also note shortness of breath and lightheadedness which primarily occurs with position changes.  Denies syncope no chest pain or abdominal pain.  He will sometimes vomit.  Blood clots after prolonged periods of epistaxis.  No fevers or chills.  The history is provided by the patient.    Past Medical History:  Diagnosis Date  . Asthma   . Environmental allergies     Patient Active Problem List   Diagnosis Date Noted  . Epistaxis 12/09/2017  . Symptomatic anemia 12/09/2017  . Asthma, chronic, mild persistent, uncomplicated 29/79/8921  . Hypokalemia 12/09/2017    History reviewed. No pertinent surgical history.      Home Medications    Prior to Admission medications   Medication Sig Start Date End Date Taking? Authorizing  Provider  fluticasone (FLONASE) 50 MCG/ACT nasal spray Place 1 spray into both nostrils daily.  11/30/17  Yes [provider]  cyclobenzaprine (FLEXERIL) 5 MG tablet Take 1 tablet (5 mg total) by mouth every 8 (eight) hours as needed for muscle spasms. Patient not taking: Reported on 12/09/2017 06/13/15   Duanne Guess, PA-C  ibuprofen (ADVIL,MOTRIN) 800 MG tablet Take 1 tablet (800 mg total) by mouth every 8 (eight) hours as needed. Patient not taking: Reported on 12/09/2017 06/13/15   Duanne Guess, PA-C    Family History Family History  Problem Relation Age of Onset  . Diabetes Mother   . Cancer Neg Hx   . CAD Neg Hx     Social History Social History   Tobacco Use  . Smoking status: Never Smoker  . Smokeless tobacco: Never Used  Substance Use Topics  . Alcohol use: No    Comment: OCCASIONAL  . Drug use: No     Allergies   Other   Review of Systems Review of Systems  Constitutional: Positive for diaphoresis and fatigue.  HENT: Positive for nosebleeds.   Respiratory: Positive for shortness of breath.   Cardiovascular: Negative for chest pain.  Gastrointestinal: Positive for blood in stool and vomiting. Negative for abdominal pain.  Genitourinary: Negative for hematuria.  Neurological: Positive for light-headedness. Negative for syncope.  All other systems reviewed and are negative.    Physical Exam Updated  Vital Signs BP (!) 153/86 (BP Location: Left Arm)   Pulse (!) 101   Resp 18   Ht 6\' 6"  (1.981 m)   Wt 105.9 kg (233 lb 7.5 oz)   SpO2 100%   BMI 26.98 kg/m   Physical Exam  Constitutional: He appears well-developed and well-nourished. No distress.  Sitting upright in chair.  Diaphoretic.  Somewhat pale.  Blood on clothing.  HENT:  Head: Normocephalic.  Nasal septum is midline, no septal hematoma noted.  There is a constant steady stream of blood coming from the right nare and I am unable to visualize much from this area.  Left nare is  patent, no septal abrasion noted.  He is tolerating secretions without difficulty and speaking in full sentences.  There is some blood noted in the posterior oropharynx.  Eyes: Conjunctivae are normal. Right eye exhibits no discharge. Left eye exhibits no discharge.  Neck: Normal range of motion. Neck supple. No JVD present. No tracheal deviation present.  Cardiovascular:  Tachycardic, 2+ radial and DP/PT pulses bl  Pulmonary/Chest: Effort normal and breath sounds normal. No stridor. No respiratory distress.  Mildly tachypneic, speaking in full sentences without difficulty, equal rise and fall of chest  Abdominal: Soft. Bowel sounds are normal. He exhibits no distension. There is no tenderness. There is no guarding.  Genitourinary:  Genitourinary Comments: Examination performed in the presence of a chaperone.  No frank rectal bleeding.  2 small hemorrhoids which are not thrombosed or tender.  No fissures or tears.  Scant amount of stool in the rectal vault.  Musculoskeletal: He exhibits no edema.  Neurological: He is alert.  Skin: Skin is warm. He is diaphoretic. No erythema.  Psychiatric: He has a normal mood and affect. His behavior is normal.  Nursing note and vitals reviewed.    ED Treatments / Results  Labs (all labs ordered are listed, but only abnormal results are displayed) Labs Reviewed  BASIC METABOLIC PANEL - Abnormal; Notable for the following components:      Result Value   Potassium 3.4 (*)    Glucose, Bld 160 (*)    BUN 23 (*)    Calcium 8.7 (*)    All other components within normal limits  CBC WITH DIFFERENTIAL/PLATELET - Abnormal; Notable for the following components:   RBC 3.62 (*)    Hemoglobin 11.8 (*)    HCT 33.5 (*)    All other components within normal limits  CBC WITH DIFFERENTIAL/PLATELET - Abnormal; Notable for the following components:   RBC 3.05 (*)    Hemoglobin 9.8 (*)    HCT 28.4 (*)    All other components within normal limits  CBC - Abnormal;  Notable for the following components:   RBC 2.84 (*)    Hemoglobin 9.2 (*)    HCT 26.2 (*)    All other components within normal limits  PROTIME-INR  APTT  HIV ANTIBODY (ROUTINE TESTING)  MAGNESIUM  PHOSPHORUS  TSH  COMPREHENSIVE METABOLIC PANEL  CBC  CBC  CBC  POC OCCULT BLOOD, ED  I-STAT TROPONIN, ED  I-STAT CG4 LACTIC ACID, ED  TYPE AND SCREEN  ABO/RH    EKG EKG Interpretation  Date/Time:  Friday December 09 2017 13:13:58 EDT Ventricular Rate:  117 PR Interval:    QRS Duration: 117 QT Interval:  323 QTC Calculation: 451 R Axis:   60 Text Interpretation:  Sinus tachycardia Confirmed by Lajean Saver [phone]) on 12/09/2017 2:04:50 PM   Radiology No results found.  Procedures .Epistaxis  Management Date/Time: 12/10/2017 1:11 AM Performed by: Renita Papa, PA-C Authorized by: Renita Papa, PA-C   Consent:    Consent obtained:  Verbal   Consent given by:  Patient   Risks discussed:  Bleeding, nasal injury and pain Anesthesia (see MAR for exact dosages):    Anesthesia method:  None Procedure details:    Treatment site:  R posterior and R anterior   Treatment method:  Nasal tampon   Treatment episode: initial   Post-procedure details:    Assessment:  Bleeding decreased   Patient tolerance of procedure:  Tolerated well, no immediate complications .Epistaxis Management Date/Time: 12/10/2017 1:12 AM Performed by: Renita Papa, PA-C Authorized by: Renita Papa, PA-C   Consent:    Consent obtained:  Verbal   Consent given by:  Patient   Risks discussed:  Bleeding, infection and pain Anesthesia (see MAR for exact dosages):    Anesthesia method:  None Procedure details:    Treatment site:  R anterior and R posterior   Treatment method:  Nasal tampon   Treatment episode: recurring   Post-procedure details:    Assessment:  Bleeding stopped   Patient tolerance of procedure:  Tolerated well, no immediate complications   (including critical care  time)  Medications Ordered in ED Medications  acetaminophen (TYLENOL) tablet 650 mg (has no administration in time range)    Or  acetaminophen (TYLENOL) suppository 650 mg (has no administration in time range)  HYDROcodone-acetaminophen (NORCO/VICODIN) 5-325 MG per tablet 1-2 tablet (has no administration in time range)  ondansetron (ZOFRAN) tablet 4 mg (has no administration in time range)    Or  ondansetron (ZOFRAN) injection 4 mg (has no administration in time range)  0.9 %  sodium chloride infusion ( Intravenous New Bag/Given 12/09/17 2230)  oxymetazoline (AFRIN) 0.05 % nasal spray 1 spray (1 spray Each Nare Given 12/09/17 1305)  sodium chloride 0.9 % bolus 1,000 mL (0 mLs Intravenous Stopped 12/09/17 1344)  sodium chloride 0.9 % bolus 1,000 mL (0 mLs Intravenous Stopped 12/09/17 1607)  ondansetron (ZOFRAN-ODT) disintegrating tablet 4 mg (4 mg Oral Given 12/09/17 1706)  sodium chloride 0.9 % bolus 1,000 mL (0 mLs Intravenous Stopped 12/09/17 2130)     Initial Impression / Assessment and Plan / ED Course  I have reviewed the triage vital signs and the nursing notes.  Pertinent labs & imaging results that were available during my care of the patient were reviewed by me and considered in my medical decision making (see chart for details).     Patient with complaint of recurrent epistaxis.  He is afebrile, persistently tachycardic in the ED.  At one point he was borderline hypotensive.  On initial examination he was diaphoretic and uncomfortable, covered in blood.  Bleeding appears to be coming only from the right nare, unable to visualize whether anterior or posterior.  Afrin and direct pressure did not achieve hemostasis, so a Rhino Rocket was applied.  It did require replacement with a new Rhino Rocket after slipping out while the patient was sleeping. Hemoccult negative, but scant amount of stool on hemoccult card.  Suspect complaint of melanotic stools secondary to ingesting blood from  epistaxis.  Chart review using care everywhere shows that his hemoglobin has dropped from his baseline of around 15 to 11.8 today. 3:08 PM Spoke with Dr. Redmond Baseman with [facility].  Patient has an appointment with [facility] later this month.  He states that it is reasonable to contact them and attempt to  set up a follow-up appointment in the next 3 to 5 days for reevaluation and removal of the [employer]. 3:12 PM Spoke with Levada Dy at [facility].  She has set up an appointment for the patient with Dr. Tami Ribas on Tuesday, June 18 at 10 AM in their [place] office.   7:20 PM Patient remains persistently tachycardic at rest while in the ED despite fluid rehydration with 2 L of fluid.  His hemoglobin has dropped another 2 g on reevaluation.  Additionally, he is orthostatic and feels quite lightheaded and ambulates with an unsteady gait when attempting to ambulate the patient.  Spoke with Dr. Onalee Hua who agrees to assume care of the patient and bring him into the hospital for further evaluation and observation. Final Clinical Impressions(s) / ED Diagnoses   Final diagnoses:  Right-sided epistaxis    ED Discharge Orders    None       Debroah Baller 12/10/17 0116    Lajean Saver, MD 12/10/17 [phone]

## 2017-12-09 NOTE — ED Notes (Signed)
ED Provider at bedside, this RN assisted PA with patient ambulation due to size and compliants of dzziness earlier.  When assisted to sitting position, pt was fine. When pt stood and took few steps, pt c/o dizziness, this RN instructed patient to sit back down on bed.  Pt given peanut butter crackers and ginger ale.

## 2017-12-10 DIAGNOSIS — J4530 Mild persistent asthma, uncomplicated: Secondary | ICD-10-CM

## 2017-12-10 DIAGNOSIS — R040 Epistaxis: Secondary | ICD-10-CM | POA: Diagnosis not present

## 2017-12-10 DIAGNOSIS — E876 Hypokalemia: Secondary | ICD-10-CM | POA: Diagnosis not present

## 2017-12-10 DIAGNOSIS — D62 Acute posthemorrhagic anemia: Secondary | ICD-10-CM | POA: Diagnosis not present

## 2017-12-10 HISTORY — DX: Acute posthemorrhagic anemia: D62

## 2017-12-10 LAB — CBC
HCT: 26.2 % — ABNORMAL LOW (ref 39.0–52.0)
HCT: 26.9 % — ABNORMAL LOW (ref 39.0–52.0)
HCT: 27.2 % — ABNORMAL LOW (ref 39.0–52.0)
HEMOGLOBIN: 9.2 g/dL — AB (ref 13.0–17.0)
Hemoglobin: 9.5 g/dL — ABNORMAL LOW (ref 13.0–17.0)
Hemoglobin: 9.3 g/dL — ABNORMAL LOW (ref 13.0–17.0)
MCH: 32.2 pg (ref 26.0–34.0)
MCH: 32.2 pg (ref 26.0–34.0)
MCH: 32.4 pg (ref 26.0–34.0)
MCHC: 34.9 g/dL (ref 30.0–36.0)
MCHC: 34.6 g/dL (ref 30.0–36.0)
MCHC: 35.1 g/dL (ref 30.0–36.0)
MCV: 92.2 fL (ref 78.0–100.0)
MCV: 92.3 fL (ref 78.0–100.0)
MCV: 93.1 fL (ref 78.0–100.0)
PLATELETS: 197 10*3/uL (ref 150–400)
PLATELETS: 198 10*3/uL (ref 150–400)
Platelets: 207 10*3/uL (ref 150–400)
RBC: 2.84 MIL/uL — ABNORMAL LOW (ref 4.22–5.81)
RBC: 2.89 MIL/uL — AB (ref 4.22–5.81)
RBC: 2.95 MIL/uL — ABNORMAL LOW (ref 4.22–5.81)
RDW: 13 % (ref 11.5–15.5)
RDW: 13 % (ref 11.5–15.5)
RDW: 13 % (ref 11.5–15.5)
WBC: 7.7 10*3/uL (ref 4.0–10.5)
WBC: 8 10*3/uL (ref 4.0–10.5)
WBC: 9.3 10*3/uL (ref 4.0–10.5)

## 2017-12-10 LAB — COMPREHENSIVE METABOLIC PANEL
ALK PHOS: 71 U/L (ref 38–126)
ALT: 15 U/L — AB (ref 17–63)
AST: 16 U/L (ref 15–41)
Albumin: 3.6 g/dL (ref 3.5–5.0)
Anion gap: 6 (ref 5–15)
BILIRUBIN TOTAL: 0.6 mg/dL (ref 0.3–1.2)
BUN: 19 mg/dL (ref 6–20)
CALCIUM: 8.6 mg/dL — AB (ref 8.9–10.3)
CHLORIDE: 109 mmol/L (ref 101–111)
CO2: 27 mmol/L (ref 22–32)
CREATININE: 0.68 mg/dL (ref 0.61–1.24)
GFR calc Af Amer: >60 mL/min (ref >60)
GFR calc non Af Amer: >60 mL/min (ref >60)
Glucose, Bld: 108 mg/dL — ABNORMAL HIGH (ref 65–99)
Potassium: 4 mmol/L (ref 3.5–5.1)
Sodium: 142 mmol/L (ref 135–145)
TOTAL PROTEIN: 6 g/dL — AB (ref 6.5–8.1)

## 2017-12-10 LAB — PHOSPHORUS: PHOSPHORUS: 3.4 mg/dL (ref 2.5–4.6)

## 2017-12-10 LAB — TSH: TSH: 0.657 u[IU]/mL (ref 0.350–4.500)

## 2017-12-10 LAB — HIV ANTIBODY (ROUTINE TESTING W REFLEX): HIV Screen 4th Generation wRfx: NONREACTIVE

## 2017-12-10 LAB — MAGNESIUM: MAGNESIUM: 1.7 mg/dL (ref 1.7–2.4)

## 2017-12-10 MED ORDER — ACETAMINOPHEN 325 MG PO TABS: 650.0000 mg | ORAL_TABLET | Freq: Four times a day (QID) | ORAL | Status: DC | PRN

## 2017-12-10 NOTE — Discharge Summary (Signed)
Physician Discharge Summary  Gary Simpson ZOX:[id number] DOB: [birth year] DOA: 12/09/2017  PCP: Erma Pinto, MD  Admit date: 12/09/2017 Discharge date: 12/10/2017  Admitted From: home Disposition:  home   Recommendations for Outpatient Follow-up:  1. F/u with ENT 2. Check Hb in 1 month   Discharge Condition:  stable   CODE STATUS:  Full code   Consultations:  ENT   Discharge Diagnoses:  Principal Problem:   Epistaxis Active Problems:   Anemia associated with acute blood loss   Asthma, chronic, mild persistent, uncomplicated   Hypokalemia    Subjective: No longer bleeding. No pain, nausea, vomiting.   Brief Summary:  22 y/o male with asthma, seasonal allergies who has been having nose bleeds for 2 wks and is also having black stools. He was initially evaluated on 5/29 for sudden onset of bleeding from right nostril. It resolved with Afrin and pressure. He continued to have bleeding every other day and then noted black stools.   In the ED,a Rhino Rocket was placed by the ED doc in the right nares. It slipped out while he was asleep and was replaced. Hb noted to have dropped 2 grams since last visit.   FOB was negative. Despite receiving 2 L NS, when he ambulated, he became lightheaded and was therefore admitted.   Hospital Course:  Right nares epistaxis - Rhino Rocket has come out with sneezing but no longer bleeding. An appt was set up with ENT for this coming Tues - asked to avoid NSAIDs and use Afrin if bleeding recurs. If it does not resolve, he knows to come back to the ED.  Anemia- acute blood loss - Hb 11.8 on 6/14 >> 9.3 (IVF given as well so partly dilutional) - total 3 L NS given - no longer dizzy- advise to eat high iron containing foods  Asthma - he states is is fairly controlled- he does have an inhaler at home to use PRN   Discharge Exam: Vitals:   12/09/17 2100 12/10/17 0600  BP:  (!) 145/82  Pulse:  (!) 104  Resp:  16  Temp:  98.4 F (36.9 C)   SpO2: 100% 100%   Vitals:   12/09/17 2030 12/09/17 2100 12/09/17 2238 12/10/17 0600  BP: (!) 153/86   (!) 145/82  Pulse: (!) 101   (!) 104  Resp: 18   16  Temp:    98.4 F (36.9 C)  TempSrc:    Axillary  SpO2: 100% 100%  100%  Weight:   105.9 kg (233 lb 7.5 oz)   Height:   6\' 6"  (1.981 m)     General: Pt is alert, awake, not in acute distress Cardiovascular: RRR, S1/S2 +, no rubs, no gallops Respiratory: CTA bilaterally, no wheezing, no rhonchi Abdominal: Soft, NT, ND, bowel sounds + Extremities: no edema, no cyanosis   Discharge Instructions  Discharge Instructions    Diet - low sodium heart healthy   Complete by:  As directed    Increase activity slowly   Complete by:  As directed      Allergies as of 12/10/2017      Reactions   Other    Cats and Dogs.      Medication List    STOP taking these medications   cyclobenzaprine 5 MG tablet Commonly known as:  FLEXERIL   fluticasone 50 MCG/ACT nasal spray Commonly known as:  FLONASE   ibuprofen 800 MG tablet Commonly known as:  ADVIL,MOTRIN     TAKE these  medications   acetaminophen 325 MG tablet Commonly known as:  TYLENOL Take 2 tablets (650 mg total) by mouth every 6 (six) hours as needed for mild pain (or Fever >/= 101).       Allergies  Allergen Reactions  . Other     Cats and Dogs.     Procedures/Studies:   No results found.   The results of significant diagnostics from this hospitalization (including imaging, microbiology, ancillary and laboratory) are listed below for reference.     Microbiology: No results found for this or any previous visit (from the past 240 hour(s)).   Labs: BNP (last 3 results) No results for input(s): BNP in the last 8760 hours. Basic Metabolic Panel: Recent Labs  Lab 12/09/17 1312 12/10/17 0602  NA 141 142  K 3.4* 4.0  CL 109 109  CO2 25 27  GLUCOSE 160* 108*  BUN 23* 19  CREATININE 0.77 0.68  CALCIUM 8.7* 8.6*  MG  --  1.7  PHOS  --  3.4    Liver Function Tests: Recent Labs  Lab 12/10/17 0602  AST 16  ALT 15*  ALKPHOS 71  BILITOT 0.6  PROT 6.0*  ALBUMIN 3.6   No results for input(s): LIPASE, AMYLASE in the last 168 hours. No results for input(s): AMMONIA in the last 168 hours. CBC: Recent Labs  Lab 12/09/17 1312 12/09/17 1737 12/10/17 0039 12/10/17 0602 12/10/17 0739  WBC 6.0 8.6 7.7 8.0 9.3  NEUTROABS 3.0 6.8  --   --   --   HGB 11.8* 9.8* 9.2* 9.5* 9.3*  HCT 33.5* 28.4* 26.2* 27.2* 26.9*  MCV 92.5 93.1 92.3 92.2 93.1  PLT 249 189 207 197 198   Cardiac Enzymes: No results for input(s): CKTOTAL, CKMB, CKMBINDEX, TROPONINI in the last 168 hours. BNP: Invalid input(s): POCBNP CBG: No results for input(s): GLUCAP in the last 168 hours. D-Dimer No results for input(s): DDIMER in the last 72 hours. Hgb A1c No results for input(s): HGBA1C in the last 72 hours. Lipid Profile No results for input(s): CHOL, HDL, LDLCALC, TRIG, CHOLHDL, LDLDIRECT in the last 72 hours. Thyroid function studies Recent Labs    12/10/17 0602  TSH 0.657   Anemia work up No results for input(s): VITAMINB12, FOLATE, FERRITIN, TIBC, IRON, RETICCTPCT in the last 72 hours. Urinalysis No results found for: COLORURINE, APPEARANCEUR, LABSPEC, [place], GLUCOSEU, HGBUR, BILIRUBINUR, KETONESUR, PROTEINUR, UROBILINOGEN, NITRITE, LEUKOCYTESUR Sepsis Labs Invalid input(s): PROCALCITONIN,  WBC,  LACTICIDVEN Microbiology No results found for this or any previous visit (from the past 240 hour(s)).   Time coordinating discharge in minutes: 60  SIGNED:   Debbe Odea, MD  [facility] 12/10/2017, 10:14 AM Pager   If 7PM-7AM, please contact night-coverage [email or web address] Password [email or web address]

## 2017-12-10 NOTE — Progress Notes (Signed)
Nose packing came out. No sign of bleeding.

## 2017-12-10 NOTE — Discharge Instructions (Signed)
Do not blow your nose or put anything in it. Stop using Flonase. Do not use aspirin, Motrin, Ibuprofen, Alleve, Naproxen. Use Tylenol for pain.  Drink fluids, eat well and rest. Eat foods containing Iron.  Use Afrin if the nose begins to ooze but if bleeding does not stop, come to the ED.  See the ENT on Tuesday.   You were cared for by a hospitalist during your hospital stay. If you have any questions about your discharge medications or the care you received while you were in the hospital after you are discharged, you can call the unit and asked to speak with the hospitalist on call if the hospitalist that took care of you is not available. Once you are discharged, your primary care physician will handle any further medical issues. Please note that NO REFILLS for any discharge medications will be authorized once you are discharged, as it is imperative that you return to your primary care physician (or establish a relationship with a primary care physician if you do not have one) for your aftercare needs so that they can reassess your need for medications and monitor your lab values.  Please take all your medications with you for your next visit with your Primary MD. Please ask your Primary MD to get all Hospital records sent to his/her office. Please request your Primary MD to go over all hospital test results at the follow up.    If you experience worsening of your admission symptoms, develop shortness of breath, chest pain, suicidal or homicidal thoughts or a life threatening emergency, you must seek medical attention immediately by calling 911 or calling your MD.   Dennis Bast must read the complete instructions/literature along with all the possible adverse reactions/side effects for all the medicines you take including new medications that have been prescribed to you. Take new medicines after you have completely understood and accpet all the possible adverse reactions/side effects.    Do not drive when  taking pain medications or sedatives.     Do not take more than prescribed Pain, Sleep and Anxiety Medications   If you have smoked or chewed Tobacco in the last 2 yrs please stop. Stop any regular alcohol  and or recreational drug use.   Wear Seat belts while driving.

## 2018-01-11 DIAGNOSIS — M898X8 Other specified disorders of bone, other site: Secondary | ICD-10-CM | POA: Insufficient documentation

## 2018-01-11 DIAGNOSIS — J324 Chronic pansinusitis: Secondary | ICD-10-CM | POA: Insufficient documentation

## 2018-02-09 HISTORY — PX: NOSE SURGERY: SHX723

## 2018-03-15 DIAGNOSIS — D106 Benign neoplasm of nasopharynx: Secondary | ICD-10-CM | POA: Insufficient documentation

## 2019-04-12 ENCOUNTER — Other Ambulatory Visit: Payer: Self-pay

## 2019-04-12 ENCOUNTER — Ambulatory Visit
Admission: EM | Admit: 2019-04-12 | Discharge: 2019-04-12 | Disposition: A | Discharge status: Home / Self Care | Payer: Commercial Managed Care - PPO | Attending: Family Medicine | Admitting: Family Medicine

## 2019-04-12 DIAGNOSIS — K5900 Constipation, unspecified: Secondary | ICD-10-CM

## 2019-04-12 DIAGNOSIS — K625 Hemorrhage of anus and rectum: Secondary | ICD-10-CM | POA: Diagnosis not present

## 2019-04-12 MED ORDER — DOCUSATE SODIUM 100 MG PO CAPS: 100.0000 mg | ORAL_CAPSULE | Freq: Two times a day (BID) | ORAL | 0 refills | Status: DC

## 2019-04-12 NOTE — Discharge Instructions (Signed)
Medication as directed.  If persists, please let us know so that I can refer you to GI.  Take care  Dr. Lacinda Axon

## 2019-04-12 NOTE — ED Provider Notes (Signed)
[facility]    CSN: [id number] Arrival date & time: 04/12/19  1754   History   Chief Complaint Chief Complaint  Patient presents with  . Rectal Bleeding   HPI  23 year old male presents with the above complaint.  Patient reports that earlier this year he had a few bouts of rectal bleeding.  He states that this resolved quickly after he switched toilet tissue.  Patient reports that over the weekend he noted blood again with wiping and with his bowel movements.  Bright red blood per rectum.  No melena.  Denies rectal pain.  No dietary changes.  No known inciting factor.  He does note that he has intermittent constipation as not all of his stools are soft.  No relieving factors.  No other associated symptoms.  No other complaints.  PMH, Surgical Hx, Family Hx, Social History reviewed and updated as below.  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/11/2018  Overview:   Added automatically from request for surgery [id number]   Chronic rhinitis 01/11/2018  Chronic pansinusitis 01/11/2018  Anemia associated with acute blood loss 12/10/2017  Epistaxis 12/09/2017  Asthma, chronic, mild persistent, uncomplicated [id number]  Hypokalemia     Surgical Hx: TONSILLECTOMY AND ADENOIDECTOMY      IR EMBOLIZATION (NON CNS) EXTRACRANIAL 02/22/2018  IR EMBOLIZATION (NON CNS) EXTRACRANIAL 02/22/2018 Sten Carey Bullocks, MD IMG VIR [facility]   PR CRANIOFACIAL APPROACH,EXTRADURAL+ 02/23/2018 Left Procedure: [place]; XTRDURL INCL MAXILLECT; Surgeon: Fara Boros, MD; Location: MAIN OR [facility]; Service: ENT   PR RESECT INFRATEMP FOSSA/EXTRADURL 02/23/2018 Left Procedure: RESECT/EXC LES INFRATEMPORAL FOSSA; EXTRADURAL; Surgeon: Fara Boros, MD; Location: MAIN OR [place]; Service: ENT   PR RESECT BASE ANT CRAN FOSSA/EXTRADURL 02/23/2018 Left Procedure: RESECTION/EXCISION LESION BASE ANTERIOR CRANIAL FOSSA; EXTRADURAL; Surgeon: Fara Boros, MD; Location: MAIN OR [facility]; Service: ENT   PR EXPLOR PTERYGOMAXILL FOSSA 02/23/2018 Left Procedure: PTERYGOMAXILLARY FOSSA SURG ANY APPROACH; Surgeon: Fara Boros, MD; Location: MAIN OR [place]; Service: ENT   PR LIGATE INTERN [place] ARTERY 02/23/2018 Left Procedure: LIG ART; INT MAXIL ART TRANSANTRAL; Surgeon: Fara Boros, MD; Location: MAIN OR [place]; Service: ENT   PR MUSC MYOQ/FSCQ FLAP HEAD&NECK W/NAMED VASC PEDCL 02/23/2018 Left Procedure: MUSCLE, MYOCUTANEOUS, OR FASCIOCUTANEOUS FLAP; HEAD AND NECK WITH NAMED VASCULAR PEDICLE (IE, BUCCINATORS, GENIOGLOSSUS, TEMPORALIS, MASSETER, STERNOCLEIDOMASTOID, LEVATOR SCAPULAE); Surgeon: Fara Boros, MD; Location: MAIN OR [facility]; Service: ENT   PR STEREOTACTIC COMP ASSIST PROC,CRANIAL,EXTRADURAL 02/23/2018 Right Procedure: STEREOTACTIC COMPUTER-ASSISTED (NAVIGATIONAL) PROCEDURE; CRANIAL, EXTRADURAL; Surgeon: Fara Boros, MD; Location: MAIN OR [facility]; Service: ENT     Home Medications    Prior to Admission medications   Medication Sig Start Date End Date Taking? Authorizing Provider  docusate sodium (COLACE) 100 MG capsule Take 1 capsule (100 mg total) by mouth 2 (two) times daily. 04/12/19   Coral Spikes, DO    Family History Family History  Problem Relation Age of Onset  . Diabetes Mother   . Cancer Neg Hx   . CAD Neg Hx     Social History Social History   Tobacco Use  . Smoking status: Never Smoker  . Smokeless tobacco: Never Used  Substance Use Topics  . Alcohol use: No    Comment: OCCASIONAL  . Drug use: No     Allergies   Other   Review of Systems Review of Systems  Constitutional: Negative.   Gastrointestinal: Positive for anal bleeding and constipation. Negative for rectal pain.   Physical  Exam Triage Vital Signs ED Triage Vitals  Enc Vitals Group     BP 04/12/19 1816 121/60     Pulse Rate 04/12/19 1816 61     Resp 04/12/19 1816 18     Temp 04/12/19 1816 97.7 F (36.5  C)     Temp Source 04/12/19 1816 Oral     SpO2 04/12/19 1816 100 %     Weight 04/12/19 1814 189 lb 9.5 oz (86 kg)     Height 04/12/19 1814 6\' 6"  (1.981 m)     Head Circumference --      Peak Flow --      Pain Score 04/12/19 1814 0     Pain Loc --      Pain Edu? --      Excl. in [place]? --    Updated Vital Signs BP 121/60 (BP Location: Left Arm)   Pulse 61   Temp 97.7 F (36.5 C) (Oral)   Resp 18   Ht 6\' 6"  (1.981 m)   Wt 86 kg   SpO2 100%   BMI 21.91 kg/m   Visual Acuity Right Eye Distance:   Left Eye Distance:   Bilateral Distance:    Right Eye Near:   Left Eye Near:    Bilateral Near:     Physical Exam Vitals signs and nursing note reviewed.  Constitutional:      General: He is not in acute distress.    Appearance: Normal appearance. He is not ill-appearing.  HENT:     Head: Normocephalic and atraumatic.  Eyes:     General:        Right eye: No discharge.        Left eye: No discharge.     Conjunctiva/sclera: Conjunctivae normal.  Cardiovascular:     Rate and Rhythm: Normal rate and regular rhythm.     Heart sounds: No murmur.  Pulmonary:     Effort: Pulmonary effort is normal.     Breath sounds: Normal breath sounds. No wheezing, rhonchi or rales.  Abdominal:     General: There is no distension.     Palpations: Abdomen is soft.     Tenderness: There is no abdominal tenderness.  Neurological:     Mental Status: He is alert.  Psychiatric:        Mood and Affect: Mood normal.        Behavior: Behavior normal.    UC Treatments / Results  Labs (all labs ordered are listed, but only abnormal results are displayed) Labs Reviewed - No data to display  EKG   Radiology No results found.  Procedures Procedures (including critical care time)  Medications Ordered in UC Medications - No data to display  Initial Impression / Assessment and Plan / UC Course  I have reviewed the triage vital signs and the nursing notes.  Pertinent labs & imaging  results that were available during my care of the patient were reviewed by me and considered in my medical decision making (see chart for details).    23 year old male presents with rectal bleeding.  Suspect that this is secondary to constipation.  He is well-appearing.  Advised to take Colace as prescribed.  If persists, will need colonoscopy.  Final Clinical Impressions(s) / UC Diagnoses   Final diagnoses:  Constipation, unspecified constipation type  BRBPR (bright red blood per rectum)     Discharge Instructions     Medication as directed.  If persists, please let us know so that I can  refer you to GI.  Take care  Dr. Lacinda Axon    ED Prescriptions    Medication Sig Dispense Auth. Provider   docusate sodium (COLACE) 100 MG capsule Take 1 capsule (100 mg total) by mouth 2 (two) times daily. 60 capsule Thersa Salt G, DO     PDMP not reviewed this encounter.   Coral Spikes, [place] 04/12/19 1923

## 2019-04-12 NOTE — ED Triage Notes (Signed)
Patient states that he has been noticing bright red blood when he wipes after having a large BM. Patient reports that it had improved at one point but has been constant.

## 2019-04-13 ENCOUNTER — Ambulatory Visit: Payer: Self-pay | Admitting: Family Medicine

## 2019-05-18 ENCOUNTER — Encounter: Payer: Self-pay | Admitting: Family Medicine

## 2019-05-18 ENCOUNTER — Ambulatory Visit (INDEPENDENT_AMBULATORY_CARE_PROVIDER_SITE_OTHER): Payer: Commercial Managed Care - PPO | Admitting: Family Medicine

## 2019-05-18 ENCOUNTER — Other Ambulatory Visit: Payer: Self-pay

## 2019-05-18 VITALS — BP 110/70 | HR 91 | Temp 97.3°F | Resp 16 | Ht 78.0 in | Wt 191.0 lb

## 2019-05-18 DIAGNOSIS — K5900 Constipation, unspecified: Secondary | ICD-10-CM | POA: Diagnosis not present

## 2019-05-18 DIAGNOSIS — K625 Hemorrhage of anus and rectum: Secondary | ICD-10-CM

## 2019-05-18 DIAGNOSIS — Z23 Encounter for immunization: Secondary | ICD-10-CM | POA: Diagnosis not present

## 2019-05-18 DIAGNOSIS — K640 First degree hemorrhoids: Secondary | ICD-10-CM

## 2019-05-18 DIAGNOSIS — D106 Benign neoplasm of nasopharynx: Secondary | ICD-10-CM

## 2019-05-18 NOTE — Patient Instructions (Addendum)
   Start taking OTC metamucil powder mixed with a liquid drink every day. Works best if taken at bedtime.    You could take OTC Fibercon tablets if you don't tolerated metamucil, but metamucil works better   Once you've been on Metamucil for a week, you can put the docusate on hold, then just take as needed.

## 2019-05-18 NOTE — Progress Notes (Signed)
Patient: Gary Simpson Male    DOB: 18-Nov-1995   23 y.o.   MRN: [id number] Visit Date: 05/18/2019  Today's Provider: Lelon Huh, MD   Chief Complaint  Patient presents with  . Establish Care   Subjective:     HPI New Patient Establishing Care: Patient is here today to establish care. Patient has a complaint of intermittent  rectal bleeding for the past 2 months. He was seen at  an Urgent care 1 month ago  and prescribed stool softeners. Patient states he continues to have rectal bleeding off and on. The last episode was this morning. He states he has been constipated again this week and states rectal bleeding always occurs during episodes of constipation. He has also notice a small lump in rectum which he thinks is a hemorrhoid.  He denies any change in diet. Does not adhere to any particular diet but rarely eats fruits or vegetables.   Allergies  Allergen Reactions  . Other     Cats and Dogs.     Current Outpatient Medications:  .  docusate sodium (COLACE) 100 MG capsule, Take 1 capsule (100 mg total) by mouth 2 (two) times daily., Disp: 60 capsule, Rfl: 0  Review of Systems  Constitutional: Negative for appetite change, chills, fatigue and fever.  HENT: Negative for congestion, ear pain, hearing loss, nosebleeds and trouble swallowing.   Eyes: Negative for pain and visual disturbance.  Respiratory: Negative for cough, chest tightness and shortness of breath.   Cardiovascular: Negative for chest pain, palpitations and leg swelling.  Gastrointestinal: Positive for anal bleeding and constipation. Negative for abdominal pain, blood in stool, diarrhea, nausea and vomiting.  Endocrine: Negative for polydipsia, polyphagia and polyuria.  Genitourinary: Negative for dysuria and flank pain.  Musculoskeletal: Negative for arthralgias, back pain, joint swelling, myalgias and neck stiffness.  Skin: Negative for color change, rash and wound.  Neurological: Negative for  dizziness, tremors, seizures, speech difficulty, weakness, light-headedness and headaches.  Psychiatric/Behavioral: Negative for behavioral problems, confusion, decreased concentration, dysphoric mood and sleep disturbance. The patient is not nervous/anxious.   All other systems reviewed and are negative.   Social History   Tobacco Use  . Smoking status: Never Smoker  . Smokeless tobacco: Never Used  Substance Use Topics  . Alcohol use: Yes    Comment: OCCASIONAL      Objective:   BP 110/70 (BP Location: Left Arm, Patient Position: Sitting, Cuff Size: Large)   Pulse 91   Temp (!) 97.3 F (36.3 C) (Temporal)   Resp 16   Ht 6\' 6"  (1.981 m)   Wt 191 lb (86.6 kg)   SpO2 95% Comment: room air  BMI 22.07 kg/m  Vitals:   05/18/19 0817  BP: 110/70  Pulse: 91  Resp: 16  Temp: (!) 97.3 F (36.3 C)  TempSrc: Temporal  SpO2: 95%  Weight: 191 lb (86.6 kg)  Height: 6\' 6"  (1.981 m)  Body mass index is 22.07 kg/m.   Physical Exam  General appearance: Well developed, well nourished male, cooperative and in no acute distress Head: Normocephalic, without obvious abnormality, atraumatic Respiratory: Respirations even and unlabored, normal respiratory rate Extremities: All extremities are intact.  Rectal: small soft rectal mass consistent with hemorrhoid. No active bleeding.      Assessment & Plan    1. Constipation, unspecified constipation type Encouraged to consume some fruits and vegetables in diet and start taking daily fiber supplements, preferably a powder mixed in liquid.  2. Grade I hemorrhoids Discussed possible GI referral if bleeding continues.   3. Rectal bleeding Likely secondary to hemorrhoid which are in turn secondary to constipation. Will refer GI if not resolved after getting on daily fiber supplements. May continue docusate prn  4. Need for influenza vaccination  - Flu Vaccine QUAD 6+ mos PF IM (Fluarix Quad PF)  Follow up 1 month    Lelon Huh,  MD  [facility]

## 2019-06-19 ENCOUNTER — Other Ambulatory Visit: Payer: Self-pay

## 2019-06-19 ENCOUNTER — Ambulatory Visit: Payer: Self-pay | Admitting: Family Medicine

## 2019-06-19 ENCOUNTER — Ambulatory Visit: Payer: Commercial Managed Care - PPO | Admitting: Family Medicine

## 2019-06-19 ENCOUNTER — Encounter: Payer: Self-pay | Admitting: Family Medicine

## 2019-06-19 VITALS — BP 126/76 | HR 64 | Temp 97.7°F | Wt 190.4 lb

## 2019-06-19 DIAGNOSIS — K5900 Constipation, unspecified: Secondary | ICD-10-CM

## 2019-06-19 DIAGNOSIS — K640 First degree hemorrhoids: Secondary | ICD-10-CM

## 2019-06-19 NOTE — Progress Notes (Signed)
       Patient: Gary Simpson Male    DOB: 1995/11/26   23 y.o.   MRN: [id number] Visit Date: 06/19/2019  Today's Provider: Lelon Huh, MD   Chief Complaint  Patient presents with  . Constipation  . Hemorrhoids  . Rectal Bleeding   Subjective:     HPI  Follow up for constipation, hemorrhoids and rectal bleeding:  The patient was last seen for this 4 weeks ago. Changes made at last visit include encouraging patient to eat more fruits and vegetables. Patient was also advised to start daily fiber supplements (Metamucil).  He reports good compliance with treatment. Patient states he stopped Metamucil.  He feels that condition is Improved. He denies rectal bleeding, constipation has improved. He reports hemorrhoid "comes and goes" .   ------------------------------------------------------------------------------------  Allergies  Allergen Reactions  . Other     Cats and Dogs.     Current Outpatient Medications:  .  docusate sodium (COLACE) 100 MG capsule, Take 1 capsule (100 mg total) by mouth 2 (two) times daily. (Patient not taking: Reported on 06/19/2019), Disp: 60 capsule, Rfl: 0  Review of Systems  Constitutional: Negative for appetite change, chills and fever.  Respiratory: Negative for chest tightness, shortness of breath and wheezing.   Cardiovascular: Negative for chest pain and palpitations.  Gastrointestinal: Negative for abdominal pain, nausea and vomiting.    Social History   Tobacco Use  . Smoking status: Never Smoker  . Smokeless tobacco: Never Used  Substance Use Topics  . Alcohol use: Yes    Comment: OCCASIONAL      Objective:   BP 126/76 (BP Location: Right Arm, Patient Position: Sitting, Cuff Size: Large)   Pulse 64   Temp 97.7 F (36.5 C) (Temporal)   Wt 190 lb 6.4 oz (86.4 kg)   SpO2 99%   BMI 22.00 kg/m  Vitals:   06/19/19 0920  BP: 126/76  Pulse: 64  Temp: 97.7 F (36.5 C)  TempSrc: Temporal  SpO2: 99%  Weight: 190 lb  6.4 oz (86.4 kg)  Body mass index is 22 kg/m.   Physical Exam  General appearance: Well developed, well nourished male, cooperative and in no acute distress Head: Normocephalic, without obvious abnormality, atraumatic Respiratory: Respirations even and unlabored, normal respiratory rate Extremities: All extremities are intact.  Skin: Skin color, texture, turgor normal. No rashes seen  Psych: Appropriate mood and affect. Neurologic: Mental status: Alert, oriented to person, place, and time, thought content appropriate.       Assessment & Plan    1. Constipation, unspecified constipation type Resolved. Is no longer taking any stool softeners or fiber. Encouraged to start on daily fiber supplement since he does not get much fiber in his diet.   2. Grade I hemorrhoids No bleeding since last office visit. Reports that hemorrhoid does go away between BMs. Advised to call if it returns or becomes more persistent.      Lelon Huh, MD  [facility]

## 2019-07-04 ENCOUNTER — Ambulatory Visit: Payer: Commercial Managed Care - PPO | Attending: Internal Medicine

## 2019-07-04 DIAGNOSIS — Z20822 Contact with and (suspected) exposure to covid-19: Secondary | ICD-10-CM

## 2019-07-06 ENCOUNTER — Telehealth: Payer: Self-pay

## 2019-07-06 ENCOUNTER — Encounter: Payer: Self-pay | Admitting: Family Medicine

## 2019-07-06 LAB — NOVEL CORONAVIRUS, NAA: SARS-CoV-2, NAA: NOT DETECTED

## 2019-07-06 NOTE — Telephone Encounter (Signed)
Patient says his job required him to get a COVID test because his dad tested positive for COVID. Patient was exposed to his dad on 06/28/2019. Patients COVID test was done on 07/04/2019 and was negative. Patient needs a note to return to work that says his test result was negative and he is fine to return to work on Monday. Please advise if ok to write note.    Copied from [place] [id number]. Topic: General - Other >> Jul 06, 2019  1:48 PM Erick Blinks wrote: Reason for CRM: Pt needs a doctor's note to return to work, stating that he has tested negative and is able to return to work. Not currently experiencing symptoms. Requesting for note to be uploaded on [place].

## 2019-07-06 NOTE — Telephone Encounter (Signed)
Ok to write note to return to work so long as he has no symptoms.

## 2019-07-06 NOTE — Telephone Encounter (Signed)
Patient denies having any symptoms. Letter completed. Patient advised to access letter in [facility].

## 2019-07-09 NOTE — Telephone Encounter (Signed)
Note prepared on 07-06-2019 and picked up by patient on 07/09/2019

## 2019-07-31 DIAGNOSIS — K642 Third degree hemorrhoids: Secondary | ICD-10-CM | POA: Diagnosis not present

## 2019-08-09 DIAGNOSIS — K648 Other hemorrhoids: Secondary | ICD-10-CM | POA: Diagnosis not present

## 2019-08-30 DIAGNOSIS — K642 Third degree hemorrhoids: Secondary | ICD-10-CM | POA: Diagnosis not present

## 2020-07-07 ENCOUNTER — Encounter: Payer: Self-pay | Admitting: Family Medicine

## 2020-07-07 ENCOUNTER — Ambulatory Visit (INDEPENDENT_AMBULATORY_CARE_PROVIDER_SITE_OTHER): Payer: BC Managed Care – PPO | Admitting: Family Medicine

## 2020-07-07 ENCOUNTER — Other Ambulatory Visit: Payer: Self-pay

## 2020-07-07 VITALS — BP 134/78 | HR 66 | Temp 97.7°F | Resp 18 | Wt 230.6 lb

## 2020-07-07 DIAGNOSIS — R5383 Other fatigue: Secondary | ICD-10-CM | POA: Diagnosis not present

## 2020-07-07 DIAGNOSIS — Z8669 Personal history of other diseases of the nervous system and sense organs: Secondary | ICD-10-CM

## 2020-07-07 DIAGNOSIS — R400 Somnolence: Secondary | ICD-10-CM | POA: Diagnosis not present

## 2020-07-07 DIAGNOSIS — J4520 Mild intermittent asthma, uncomplicated: Secondary | ICD-10-CM | POA: Diagnosis not present

## 2020-07-07 MED ORDER — ALBUTEROL SULFATE HFA 108 (90 BASE) MCG/ACT IN AERS: 2.0000 | INHALATION_SPRAY | Freq: Four times a day (QID) | RESPIRATORY_TRACT | 0 refills | Status: DC | PRN

## 2020-07-07 NOTE — Patient Instructions (Signed)
.   Please review the attached list of medications and notify my office if there are any errors.   . Please bring all of your medications to every appointment so we can make sure that our medication list is the same as yours.   

## 2020-07-07 NOTE — Progress Notes (Signed)
      Established patient visit   Patient: Gary Simpson   DOB: [birth year]   25 y.o. Male  MRN: [id number] Visit Date: 07/07/2020  Today's healthcare provider: Lelon Huh, MD   Chief Complaint  Patient presents with  . Fatigue   Subjective    HPI  Fatigue: Patient complains of feeling more fatigued than usual for the past couple of years. Patient states he feels tired when he wakes up in the mornings. He denies any dizziness.  He described the fatigue as excessive sleepiness and a lack of energy.   He has history of N/P pharyngeoma and had sleep study and CPAP prescribed prior to its excision in 2020. However he did not tolerate CPAP well and thought that removal of the tumor would take care of OSA.      Medications: Outpatient Medications Prior to Visit  Medication Sig  . cetirizine (ZYRTEC ALLERGY) 10 MG tablet Take 10 mg by mouth daily.   No facility-administered medications prior to visit.    Review of Systems  Constitutional: Positive for fatigue. Negative for appetite change, chills and fever.  Respiratory: Negative for chest tightness, shortness of breath and wheezing.   Cardiovascular: Negative for chest pain and palpitations.  Gastrointestinal: Negative for abdominal pain, nausea and vomiting.      Objective    BP 134/78 (BP Location: Left Arm, Patient Position: Sitting, Cuff Size: Large)   Pulse 66   Temp 97.7 F (36.5 C) (Oral)   Resp 18   Wt 230 lb 9.6 oz (104.6 kg)   BMI 26.65 kg/m    Physical Exam    General: Appearance:     Well developed, well nourished male in no acute distress  Eyes:    PERRL, conjunctiva/corneas clear, EOM's intact       Lungs:     Clear to auscultation bilaterally, respirations unlabored  Heart:    Normal heart rate. Normal rhythm. No murmurs, rubs, or gallops.   MS:   All extremities are intact.   Neurologic:   Awake, alert, oriented x 3. No apparent focal neurological           defect.         Assessment & Plan      1. Sleepiness  - CBC with Differential/Platelet - Comprehensive metabolic panel - TSH  2. Other fatigue  - CBC with Differential/Platelet - Comprehensive metabolic panel - TSH  3. History of obstructive sleep apnea Is not on CPAP since having NP tumor removed in 2020. Anticipated getting UTD sleep study if labs are normal.   4. Asthma, chronic, mild intermittent Rarely requires inhaler, but need refilled.  - albuterol (VENTOLIN HFA) 108 (90 Base) MCG/ACT inhaler; Inhale 2 puffs into the lungs every 6 (six) hours as needed for wheezing or shortness of breath.  Dispense: 8 g; Refill: 0  He declined flu vaccine today.        The entirety of the information documented in the History of Present Illness, Review of Systems and Physical Exam were personally obtained by me. Portions of this information were initially documented by the CMA and reviewed by me for thoroughness and accuracy.      Lelon Huh, MD  [facility] [phone] (phone) [phone] (fax)  [place]

## 2020-07-08 ENCOUNTER — Other Ambulatory Visit: Payer: Self-pay | Admitting: Family Medicine

## 2020-07-08 DIAGNOSIS — R400 Somnolence: Secondary | ICD-10-CM

## 2020-07-08 DIAGNOSIS — R5383 Other fatigue: Secondary | ICD-10-CM

## 2020-07-08 LAB — CBC WITH DIFFERENTIAL/PLATELET
Basophils Absolute: 0.1 10*3/uL (ref 0.0–0.2)
Basos: 1 %
EOS (ABSOLUTE): 0.3 10*3/uL (ref 0.0–0.4)
Eos: 5 %
Hematocrit: 46.8 % (ref 37.5–51.0)
Hemoglobin: 16.1 g/dL (ref 13.0–17.7)
Immature Grans (Abs): 0 10*3/uL (ref 0.0–0.1)
Immature Granulocytes: 0 %
Lymphocytes Absolute: 1.4 10*3/uL (ref 0.7–3.1)
Lymphs: 22 %
MCH: 32.2 pg (ref 26.6–33.0)
MCHC: 34.4 g/dL (ref 31.5–35.7)
MCV: 94 fL (ref 79–97)
Monocytes Absolute: 0.8 10*3/uL (ref 0.1–0.9)
Monocytes: 13 %
Neutrophils Absolute: 3.7 10*3/uL (ref 1.4–7.0)
Neutrophils: 59 %
Platelets: 250 10*3/uL (ref 150–450)
RBC: 5 x10E6/uL (ref 4.14–5.80)
RDW: 12 % (ref 11.6–15.4)
WBC: 6.3 10*3/uL (ref 3.4–10.8)

## 2020-07-08 LAB — COMPREHENSIVE METABOLIC PANEL
ALT: 20 IU/L (ref 0–44)
AST: 24 IU/L (ref 0–40)
Albumin/Globulin Ratio: 2.1 (ref 1.2–2.2)
Albumin: 4.9 g/dL (ref 4.1–5.2)
Alkaline Phosphatase: 105 IU/L (ref 44–121)
BUN/Creatinine Ratio: 14 (ref 9–20)
BUN: 15 mg/dL (ref 6–20)
Bilirubin Total: 0.6 mg/dL (ref 0.0–1.2)
CO2: 25 mmol/L (ref 20–29)
Calcium: 9.5 mg/dL (ref 8.7–10.2)
Chloride: 100 mmol/L (ref 96–106)
Creatinine, Ser: 1.06 mg/dL (ref 0.76–1.27)
GFR calc Af Amer: 113 mL/min/{1.73_m2} (ref >59)
GFR calc non Af Amer: 98 mL/min/{1.73_m2} (ref >59)
Globulin, Total: 2.3 g/dL (ref 1.5–4.5)
Glucose: 89 mg/dL (ref 65–99)
Potassium: 4.4 mmol/L (ref 3.5–5.2)
Sodium: 137 mmol/L (ref 134–144)
Total Protein: 7.2 g/dL (ref 6.0–8.5)

## 2020-07-08 LAB — TSH: TSH: 1.77 u[IU]/mL (ref 0.450–4.500)

## 2020-07-28 ENCOUNTER — Other Ambulatory Visit: Payer: Self-pay | Admitting: Family Medicine

## 2020-07-28 DIAGNOSIS — J4520 Mild intermittent asthma, uncomplicated: Secondary | ICD-10-CM

## 2020-07-29 ENCOUNTER — Telehealth: Payer: Self-pay

## 2020-07-29 NOTE — Telephone Encounter (Signed)
See result note from 07-07-2020. A sleep study was ordered since labs were normal. Has this been scheduled? If not then need to contact sarah to find out the status of the sleep study.

## 2020-07-29 NOTE — Telephone Encounter (Signed)
Copied from [place] [phone]. Topic: General - Other >> Jul 29, 2020  8:36 AM Celene Kras wrote: Reason for CRM: pt called stating that he received his results back for labs. He is requesting to know if he needs to have a follow up appt with PCP. Please advise.

## 2020-07-30 NOTE — Telephone Encounter (Signed)
Gary Pang, do I need to place another order for a sleep study? It looks like you never received the order.

## 2020-07-31 NOTE — Telephone Encounter (Signed)
Order was sent to [facility] on 07/10/20 for home sleep study. I have left message to try to find out if it has been scheduled

## 2020-08-04 NOTE — Telephone Encounter (Signed)
Judson Roch, do you have an update on this message?

## 2020-08-07 NOTE — Telephone Encounter (Signed)
Pt called and is requesting to have an update on his sleep study. Please advise.

## 2020-08-08 NOTE — Telephone Encounter (Signed)
Order for home sleep study was sent to [facility] in Jan. I have left messages to try to find out what is going on with getting this pt scheduled and have not gotten answers. I sent it to [facility] today 08/08/20. They now do home sleep studies. I have also left message to return call so I can advise

## 2020-08-10 DIAGNOSIS — R350 Frequency of micturition: Secondary | ICD-10-CM | POA: Diagnosis not present

## 2020-08-10 DIAGNOSIS — N410 Acute prostatitis: Secondary | ICD-10-CM | POA: Diagnosis not present

## 2020-08-19 ENCOUNTER — Other Ambulatory Visit: Payer: Self-pay

## 2020-08-19 ENCOUNTER — Encounter: Payer: Self-pay | Admitting: Family Medicine

## 2020-08-19 ENCOUNTER — Ambulatory Visit: Payer: BC Managed Care – PPO | Admitting: Family Medicine

## 2020-08-19 VITALS — BP 109/68 | HR 80 | Temp 98.4°F | Resp 16 | Wt 224.6 lb

## 2020-08-19 DIAGNOSIS — R350 Frequency of micturition: Secondary | ICD-10-CM | POA: Diagnosis not present

## 2020-08-19 LAB — POCT URINALYSIS DIPSTICK
Bilirubin, UA: NEGATIVE
Blood, UA: NEGATIVE
Glucose, UA: NEGATIVE
Ketones, UA: NEGATIVE
Leukocytes, UA: NEGATIVE
Nitrite, UA: NEGATIVE
Protein, UA: NEGATIVE
Spec Grav, UA: 1.025 (ref 1.010–1.025)
Urobilinogen, UA: 0.2 E.U./dL
pH, UA: 7 (ref 5.0–8.0)

## 2020-08-19 NOTE — Progress Notes (Signed)
Established patient visit   Patient: Gary Simpson   DOB: 10/16/1995   25 y.o. Male  MRN: [id number] Visit Date: 08/19/2020  Today's healthcare provider: Lelon Huh, MD   Chief Complaint  Patient presents with   Urinary Frequency   Subjective    Urinary Frequency  This is a new problem. Episode onset: 10 days. The problem has been gradually improving. Associated symptoms include frequency and urgency. Pertinent negatives include no chills, nausea or vomiting. He has tried antibiotics (10 day course of Levaquin 750mg ) for the symptoms.   He was seen at [facility] 10 days ago where he had u/a and was prescribed Levaquin as above. He does not know what infection he was diagnosed with.  Patient states symptoms improved since taking antibiotics, but did not completely resolve. He still has urinary frequency and urgency. He denies any pain during urination.       Medications: Outpatient Medications Prior to Visit  Medication Sig   albuterol (VENTOLIN HFA) 108 (90 Base) MCG/ACT inhaler TAKE 2 PUFFS BY MOUTH EVERY 6 HOURS AS NEEDED FOR WHEEZE OR SHORTNESS OF BREATH   cetirizine (ZYRTEC) 10 MG tablet Take 10 mg by mouth daily.   No facility-administered medications prior to visit.    Review of Systems  Constitutional: Negative for appetite change, chills and fever.  Respiratory: Negative for chest tightness, shortness of breath and wheezing.   Cardiovascular: Negative for chest pain and palpitations.  Gastrointestinal: Negative for abdominal pain, nausea and vomiting.  Genitourinary: Positive for frequency and urgency.        Objective    BP 109/68 (BP Location: Left Arm, Patient Position: Sitting, Cuff Size: Normal)    Pulse 80    Temp 98.4 F (36.9 C) (Temporal)    Resp 16    Wt 224 lb 9.6 oz (101.9 kg)    BMI 25.96 kg/m      Physical Exam   General: Appearance:     Well developed, well nourished male in no acute distress  Eyes:    PERRL, conjunctiva/corneas  clear, EOM's intact       Lungs:     Clear to auscultation bilaterally, respirations unlabored  Heart:    Normal heart rate. Normal rhythm. No murmurs, rubs, or gallops.   MS:   All extremities are intact.   Neurologic:   Awake, alert, oriented x 3. No apparent focal neurological           defect.         Results for orders placed or performed in visit on 08/19/20  POCT Urinalysis Dipstick  Result Value Ref Range   Color, UA yellow    Clarity, UA cloudy    Glucose, UA Negative Negative   Bilirubin, UA negative    Ketones, UA negative    Spec Grav, UA 1.025 1.010 - 1.025   Blood, UA negative    pH, UA 7.0 5.0 - 8.0   Protein, UA Negative Negative   Urobilinogen, UA 0.2 0.2 or 1.0 E.U./dL   Nitrite, UA negative    Leukocytes, UA Negative Negative   Appearance     Odor      Assessment & Plan     1. Urinary frequency Resolved after finishing 10 day course of levofloxacin prescribed at [facility]. Send for records from [facility]. If he had bladder or proximal urinary tract infection then will need urology referral. If he was thought to have prostate infection then no additional follow up needed  for the time being.        The entirety of the information documented in the History of Present Illness, Review of Systems and Physical Exam were personally obtained by me. Portions of this information were initially documented by the CMA and reviewed by me for thoroughness and accuracy.      Lelon Huh, MD  [facility] [phone] (phone) [phone] (fax)  [place]

## 2020-08-20 ENCOUNTER — Encounter: Payer: Self-pay | Admitting: Family Medicine

## 2020-08-27 ENCOUNTER — Encounter: Payer: Self-pay | Admitting: Family Medicine

## 2020-08-27 DIAGNOSIS — G4733 Obstructive sleep apnea (adult) (pediatric): Secondary | ICD-10-CM | POA: Insufficient documentation

## 2020-08-30 ENCOUNTER — Other Ambulatory Visit: Payer: Self-pay | Admitting: Family Medicine

## 2020-08-30 DIAGNOSIS — J4520 Mild intermittent asthma, uncomplicated: Secondary | ICD-10-CM

## 2020-08-30 NOTE — Telephone Encounter (Signed)
Requested Prescriptions  Pending Prescriptions Disp Refills  . albuterol (VENTOLIN HFA) 108 (90 Base) MCG/ACT inhaler [Pharmacy Med Name: ALBUTEROL HFA (PROVENTIL) INH] 6.7 each 0    Sig: TAKE 2 PUFFS BY MOUTH EVERY 6 HOURS AS NEEDED FOR WHEEZE OR SHORTNESS OF BREATH     Pulmonology:  Beta Agonists Failed - 08/30/2020  9:02 AM      Failed - One inhaler should last at least one month. If the patient is requesting refills earlier, contact the patient to check for uncontrolled symptoms.      Passed - Valid encounter within last 12 months    Recent Outpatient Visits          1 week ago Urinary frequency   [facility] Birdie Sons, MD   1 month ago [place], Donald E, MD   1 year ago Constipation, unspecified constipation type   [facility] Birdie Sons, MD   1 year ago Constipation, unspecified constipation type   [facility] Birdie Sons, MD

## 2020-08-31 DIAGNOSIS — S99921A Unspecified injury of right foot, initial encounter: Secondary | ICD-10-CM | POA: Diagnosis not present

## 2020-08-31 DIAGNOSIS — Z23 Encounter for immunization: Secondary | ICD-10-CM | POA: Diagnosis not present

## 2020-08-31 DIAGNOSIS — S90811A Abrasion, right foot, initial encounter: Secondary | ICD-10-CM | POA: Diagnosis not present

## 2020-10-15 DIAGNOSIS — R350 Frequency of micturition: Secondary | ICD-10-CM | POA: Diagnosis not present

## 2020-11-04 DIAGNOSIS — M898X8 Other specified disorders of bone, other site: Secondary | ICD-10-CM | POA: Diagnosis not present

## 2020-11-04 DIAGNOSIS — J310 Chronic rhinitis: Secondary | ICD-10-CM | POA: Diagnosis not present

## 2022-08-06 ENCOUNTER — Ambulatory Visit: Payer: BC Managed Care – PPO | Admitting: Physician Assistant

## 2022-08-06 ENCOUNTER — Encounter: Payer: Self-pay | Admitting: Family Medicine

## 2022-08-06 ENCOUNTER — Ambulatory Visit: Payer: BC Managed Care – PPO | Admitting: Family Medicine

## 2022-08-06 VITALS — BP 129/74 | HR 86 | Temp 97.4°F | Ht 78.0 in | Wt 270.0 lb

## 2022-08-06 DIAGNOSIS — G4733 Obstructive sleep apnea (adult) (pediatric): Secondary | ICD-10-CM | POA: Diagnosis not present

## 2022-08-07 NOTE — Progress Notes (Signed)
      Established patient visit   Patient: Gary Simpson   DOB: 1996-01-10   27 y.o. Male  MRN: [id number] Visit Date: 08/06/2022  Today's healthcare provider: Lelon Huh, MD    Subjective    HPI Patient presents for follow up OSA on CPAP. He has history of N/P pharyngeoma and had sleep study and CPAP prescribed prior to its excision in 2020. However he did not tolerate CPAP well and thought that removal of the tumor would take care of OSA.  He had repeat sleep study in 08/16/2020 confirming severe sleep study and CPAP ordered 08/27/2020. He states he tries to wear it consistently but mask is not comfortable due to high pressures and not staying on well. He continues to feel fatigued and sleepy during the dose and will doze off easily.   Medications: Outpatient Medications Prior to Visit  Medication Sig   albuterol (VENTOLIN HFA) 108 (90 Base) MCG/ACT inhaler TAKE 2 PUFFS BY MOUTH EVERY 6 HOURS AS NEEDED FOR WHEEZE OR SHORTNESS OF BREATH   cetirizine (ZYRTEC) 10 MG tablet Take 10 mg by mouth daily.   escitalopram (LEXAPRO) 10 MG tablet Take 10 mg by mouth daily.   Fluticasone-Salmeterol 113-14 MCG/ACT AEPB Take 1 puff by mouth 2 (two) times daily.   No facility-administered medications prior to visit.    Past Surgical History:  Procedure Laterality Date   NOSE SURGERY  02/09/2018   resection juvenile angiofibroma and left turbinate Dr. Omelia Blackwater [facility]   TONSILLECTOMY AND ADENOIDECTOMY          Objective    BP 129/74 (BP Location: Left Arm, Patient Position: Sitting, Cuff Size: Normal)   Pulse 86   Temp (!) 97.4 F (36.3 C)   Ht '6\' 6"'$  (1.981 m)   Wt 270 lb (122.5 kg)   SpO2 98%   BMI 31.20 kg/m    Physical Exam  Awake, alert, oriented x 3. In no apparent distress   Assessment & Plan     1. Severe obstructive sleep apnea Not tolerating CPAP and mask. Considering severity of sleep apnea on last study in 2022, he needs to be followed by sleep specialist  and/or ENT.      The entirety of the information documented in the History of Present Illness, Review of Systems and Physical Exam were personally obtained by me. Portions of this information were initially documented by the CMA and reviewed by me for thoroughness and accuracy.     Lelon Huh, MD  [place] [phone] (phone) [phone] (fax)  [place]

## 2022-08-18 NOTE — Progress Notes (Unsigned)
   [place],acting as a scribe for Mikey Kirschner, PA-C.,have documented all relevant documentation on the behalf of Mikey Kirschner, PA-C,as directed by  Mikey Kirschner, PA-C while in the presence of Mikey Kirschner, PA-C.     Established patient visit   Patient: Gary Simpson   DOB: 10/16/1995   27 y.o. Male  MRN: [id number] Visit Date: 08/19/2022  Today's healthcare provider: Mikey Kirschner, PA-C   No chief complaint on file.  Subjective    HPI  Patient is a 27 year old male who present to have evaluation of a lump he has noticed on his jawline.  Medications: Outpatient Medications Prior to Visit  Medication Sig   albuterol (VENTOLIN HFA) 108 (90 Base) MCG/ACT inhaler TAKE 2 PUFFS BY MOUTH EVERY 6 HOURS AS NEEDED FOR WHEEZE OR SHORTNESS OF BREATH   cetirizine (ZYRTEC) 10 MG tablet Take 10 mg by mouth daily.   escitalopram (LEXAPRO) 10 MG tablet Take 10 mg by mouth daily.   Fluticasone-Salmeterol 113-14 MCG/ACT AEPB Take 1 puff by mouth 2 (two) times daily.   No facility-administered medications prior to visit.    Review of Systems  {Labs  Heme  Chem  Endocrine  Serology  Results Review (optional):23779}   Objective    There were no vitals taken for this visit. {Show previous vital signs (optional):23777}  Physical Exam  ***  No results found for any visits on 08/19/22.  Assessment & Plan     ***  No follow-ups on file.      {provider attestation***:1}   Mikey Kirschner, PA-C  [place] [phone] (phone) [phone] (fax)  [place]

## 2022-08-19 ENCOUNTER — Ambulatory Visit: Payer: BC Managed Care – PPO | Admitting: Physician Assistant

## 2022-08-19 ENCOUNTER — Encounter: Payer: Self-pay | Admitting: Physician Assistant

## 2022-08-19 VITALS — BP 136/83 | HR 72 | Temp 97.6°F | Ht 78.0 in | Wt 271.0 lb

## 2022-08-19 DIAGNOSIS — J029 Acute pharyngitis, unspecified: Secondary | ICD-10-CM

## 2022-08-19 DIAGNOSIS — R590 Localized enlarged lymph nodes: Secondary | ICD-10-CM | POA: Diagnosis not present

## 2022-08-26 LAB — ANAEROBIC AND AEROBIC CULTURE

## 2022-08-27 ENCOUNTER — Ambulatory Visit: Payer: BC Managed Care – PPO

## 2022-08-27 ENCOUNTER — Ambulatory Visit: Payer: BC Managed Care – PPO | Admitting: Family Medicine

## 2022-09-01 ENCOUNTER — Telehealth: Payer: Self-pay

## 2022-09-01 NOTE — Telephone Encounter (Signed)
Copied from [place] [id number]. Topic: Referral - Status >> Sep 01, 2022 11:39 AM Everette C wrote: Reason for CRM: The patient has been in contact with Dr. Linton Ham office and told that no referral has been submitted for them   The patient would like for Referral [phone] to be resubmitted so they are able to see a pulmonary specialist   Please contact the patient further when possible to discuss resubmission of their referral

## 2022-09-02 NOTE — Telephone Encounter (Signed)
Patient advised that referral has been resubmitted to [facility] clinic.

## 2022-10-01 DIAGNOSIS — G4733 Obstructive sleep apnea (adult) (pediatric): Secondary | ICD-10-CM | POA: Diagnosis not present

## 2022-10-01 DIAGNOSIS — J4530 Mild persistent asthma, uncomplicated: Secondary | ICD-10-CM | POA: Diagnosis not present

## 2022-10-04 DIAGNOSIS — J4521 Mild intermittent asthma with (acute) exacerbation: Secondary | ICD-10-CM | POA: Diagnosis not present

## 2022-10-04 DIAGNOSIS — Z03818 Encounter for observation for suspected exposure to other biological agents ruled out: Secondary | ICD-10-CM | POA: Diagnosis not present

## 2022-10-04 DIAGNOSIS — J4530 Mild persistent asthma, uncomplicated: Secondary | ICD-10-CM | POA: Diagnosis not present
